# Patient Record
Sex: Male | Born: 1975 | Race: Black or African American | Hispanic: No | Marital: Single | Smoking: Current every day smoker
Health system: Southern US, Community
[De-identification: ages and names within clinical notes are randomized; demographics above are authoritative.]

## PROBLEM LIST (undated history)

## (undated) DIAGNOSIS — I10 Essential (primary) hypertension: Secondary | ICD-10-CM

## (undated) HISTORY — PX: OTHER SURGICAL HISTORY: SHX169

---

## 2000-09-14 ENCOUNTER — Inpatient Hospital Stay (HOSPITAL_COMMUNITY): Admission: EM | Admit: 2000-09-14 | Discharge: 2000-09-15 | Payer: Self-pay

## 2000-09-14 ENCOUNTER — Encounter: Payer: Self-pay | Admitting: Emergency Medicine

## 2000-10-05 ENCOUNTER — Ambulatory Visit (HOSPITAL_COMMUNITY): Admission: RE | Admit: 2000-10-05 | Discharge: 2000-10-05 | Payer: Self-pay | Admitting: Orthopedic Surgery

## 2004-12-10 ENCOUNTER — Emergency Department (HOSPITAL_COMMUNITY): Admission: EM | Admit: 2004-12-10 | Discharge: 2004-12-10 | Payer: Self-pay | Admitting: Emergency Medicine

## 2005-06-04 ENCOUNTER — Emergency Department (HOSPITAL_COMMUNITY): Admission: EM | Admit: 2005-06-04 | Discharge: 2005-06-04 | Payer: Self-pay | Admitting: Emergency Medicine

## 2005-07-07 ENCOUNTER — Emergency Department (HOSPITAL_COMMUNITY): Admission: EM | Admit: 2005-07-07 | Discharge: 2005-07-07 | Payer: Self-pay | Admitting: Emergency Medicine

## 2007-01-23 ENCOUNTER — Emergency Department (HOSPITAL_COMMUNITY): Admission: EM | Admit: 2007-01-23 | Discharge: 2007-01-23 | Payer: Self-pay | Admitting: Emergency Medicine
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
## 2011-05-14 LAB — DIFFERENTIAL
Basophils Absolute: 0
Basophils Relative: 0
Eosinophils Absolute: 0.1
Monocytes Absolute: 0.5
Neutro Abs: 4

## 2011-05-14 LAB — CBC
HCT: 41.5
Hemoglobin: 13.9
Platelets: 207
RDW: 12.9
WBC: 5.7

## 2011-05-14 LAB — COMPREHENSIVE METABOLIC PANEL
ALT: 22
Albumin: 4
Alkaline Phosphatase: 43
BUN: 10
Chloride: 107
Glucose, Bld: 106 — ABNORMAL HIGH
Potassium: 3.4 — ABNORMAL LOW
Sodium: 140
Total Bilirubin: 0.7
Total Protein: 6.5

## 2011-05-14 LAB — URINALYSIS, ROUTINE W REFLEX MICROSCOPIC
Glucose, UA: NEGATIVE
Ketones, ur: NEGATIVE
Nitrite: NEGATIVE
pH: 6

## 2012-11-08 ENCOUNTER — Ambulatory Visit (INDEPENDENT_AMBULATORY_CARE_PROVIDER_SITE_OTHER): Payer: BC Managed Care – PPO | Admitting: General Surgery

## 2012-11-08 ENCOUNTER — Encounter (INDEPENDENT_AMBULATORY_CARE_PROVIDER_SITE_OTHER): Payer: Self-pay | Admitting: General Surgery

## 2012-11-08 VITALS — BP 130/84 | HR 65 | Temp 98.3°F | Resp 16 | Ht 66.0 in | Wt 188.0 lb

## 2012-11-08 DIAGNOSIS — R1032 Left lower quadrant pain: Secondary | ICD-10-CM | POA: Insufficient documentation

## 2012-11-08 NOTE — Patient Instructions (Signed)
Will get ct pelvis to rule out left inguinal hernia

## 2012-11-08 NOTE — Progress Notes (Signed)
Subjective:     Patient ID: Ronald Hardin, male   DOB: Feb 19, 1976, 37 y.o.   MRN: [id number]  HPI We are asked to see the patient in consultation by Dr. Ronaldo Miyamoto to evaluate him for bilateral inguinal hernias. The patient is a 37 year old black male who states that he was told several years ago that he may have an inguinal hernia. 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 Since starting his job he has been experiencing some pulling left lower quadrant pain. He denies any nausea or vomiting. He has not noticed a bulge in either groin.  Review of Systems  Constitutional: Negative.   HENT: Negative.   Eyes: Negative.   Respiratory: Negative.   Cardiovascular: Negative.   Gastrointestinal: Negative.   Endocrine: Negative.   Genitourinary: Negative.   Musculoskeletal: Negative.   Skin: Negative.   Allergic/Immunologic: Negative.   Neurological: Negative.   Hematological: Negative.   Psychiatric/Behavioral: Negative.        Objective:   Physical Exam  Constitutional: He is oriented to person, place, and time. He appears well-developed and well-nourished.  HENT:  Head: Normocephalic and atraumatic.  Eyes: Conjunctivae and EOM are normal. Pupils are equal, round, and reactive to light.  Neck: Normal range of motion. Neck supple.  Cardiovascular: Normal rate, regular rhythm and normal heart sounds.   Pulmonary/Chest: Effort normal and breath sounds normal.  Abdominal: Soft. Bowel sounds are normal.  Genitourinary:  There is no obvious bulge or impulse with straining in either groin.  Musculoskeletal: Normal range of motion.  Neurological: He is alert and oriented to person, place, and time.  Skin: Skin is warm and dry.  Psychiatric: He has a normal mood and affect. His behavior is normal.       Assessment:     The patient has left lower quadrant pain. Clinically I cannot appreciate an inguinal hernia. I will plan to get a CT of his pelvis to look for  radiographic evidence of a hernia     Plan:     Plan for CT of the pelvis and then we will proceed accordingly

## 2012-11-10 ENCOUNTER — Ambulatory Visit
Admission: RE | Admit: 2012-11-10 | Discharge: 2012-11-10 | Disposition: A | Discharge status: Home / Self Care | Payer: BC Managed Care – PPO | Source: Ambulatory Visit | Attending: General Surgery | Admitting: General Surgery

## 2012-11-10 DIAGNOSIS — R1032 Left lower quadrant pain: Secondary | ICD-10-CM

## 2012-11-18 ENCOUNTER — Telehealth (INDEPENDENT_AMBULATORY_CARE_PROVIDER_SITE_OTHER): Payer: Self-pay

## 2012-11-18 NOTE — Telephone Encounter (Signed)
Called patient with negative CT report. Patient will follow up with PCP

## 2013-12-17 ENCOUNTER — Emergency Department (HOSPITAL_COMMUNITY)
Admission: EM | Admit: 2013-12-17 | Discharge: 2013-12-17 | Disposition: A | Discharge status: Home / Self Care | Payer: BC Managed Care – PPO | Attending: Emergency Medicine | Admitting: Emergency Medicine

## 2013-12-17 ENCOUNTER — Encounter (HOSPITAL_COMMUNITY): Payer: Self-pay | Admitting: Emergency Medicine

## 2013-12-17 DIAGNOSIS — F17200 Nicotine dependence, unspecified, uncomplicated: Secondary | ICD-10-CM | POA: Insufficient documentation

## 2013-12-17 DIAGNOSIS — K089 Disorder of teeth and supporting structures, unspecified: Secondary | ICD-10-CM | POA: Insufficient documentation

## 2013-12-17 DIAGNOSIS — K0889 Other specified disorders of teeth and supporting structures: Secondary | ICD-10-CM

## 2013-12-17 DIAGNOSIS — K029 Dental caries, unspecified: Secondary | ICD-10-CM | POA: Insufficient documentation

## 2013-12-17 MED ORDER — CLINDAMYCIN HCL 150 MG PO CAPS
300.0000 mg | ORAL_CAPSULE | Freq: Once | ORAL | Status: AC
  Administered 2013-12-17: 300 mg via ORAL
  Filled 2013-12-17: qty 2

## 2013-12-17 MED ORDER — CLINDAMYCIN HCL 300 MG PO CAPS: 300.0000 mg | ORAL_CAPSULE | Freq: Four times a day (QID) | ORAL | Status: DC

## 2013-12-17 MED ORDER — TRAMADOL HCL 50 MG PO TABS: 50.0000 mg | ORAL_TABLET | Freq: Four times a day (QID) | ORAL | Status: DC | PRN

## 2013-12-17 MED ORDER — OXYCODONE-ACETAMINOPHEN 5-325 MG PO TABS
1.0000 | ORAL_TABLET | Freq: Once | ORAL | Status: AC
Start: 2013-12-17 — End: 2013-12-17
  Administered 2013-12-17: 1 via ORAL
  Filled 2013-12-17: qty 1

## 2013-12-17 NOTE — Discharge Instructions (Signed)
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
## 2013-12-17 NOTE — ED Notes (Signed)
Pt reports dental pain to left upper jaw, states he has known cavities on same side.

## 2013-12-18 NOTE — ED Provider Notes (Signed)
Medical screening examination/treatment/procedure(s) were performed by non-physician practitioner and as supervising physician I was immediately available for consultation/collaboration.     Geoffery Lyons, MD 12/18/13 [phone]

## 2015-02-08 ENCOUNTER — Encounter (HOSPITAL_COMMUNITY): Payer: Self-pay | Admitting: Cardiology

## 2015-02-08 ENCOUNTER — Emergency Department (HOSPITAL_COMMUNITY)
Admission: EM | Admit: 2015-02-08 | Discharge: 2015-02-08 | Disposition: A | Discharge status: Home / Self Care | Payer: Self-pay | Attending: Emergency Medicine | Admitting: Emergency Medicine

## 2015-02-08 DIAGNOSIS — Z720 Tobacco use: Secondary | ICD-10-CM | POA: Insufficient documentation

## 2015-02-08 DIAGNOSIS — Y9289 Other specified places as the place of occurrence of the external cause: Secondary | ICD-10-CM | POA: Insufficient documentation

## 2015-02-08 DIAGNOSIS — S46812A Strain of other muscles, fascia and tendons at shoulder and upper arm level, left arm, initial encounter: Secondary | ICD-10-CM

## 2015-02-08 DIAGNOSIS — Y9389 Activity, other specified: Secondary | ICD-10-CM | POA: Insufficient documentation

## 2015-02-08 DIAGNOSIS — Y998 Other external cause status: Secondary | ICD-10-CM | POA: Insufficient documentation

## 2015-02-08 DIAGNOSIS — S46912A Strain of unspecified muscle, fascia and tendon at shoulder and upper arm level, left arm, initial encounter: Secondary | ICD-10-CM | POA: Insufficient documentation

## 2015-02-08 DIAGNOSIS — X58XXXA Exposure to other specified factors, initial encounter: Secondary | ICD-10-CM | POA: Insufficient documentation

## 2015-02-08 DIAGNOSIS — Z792 Long term (current) use of antibiotics: Secondary | ICD-10-CM | POA: Insufficient documentation

## 2015-02-08 MED ORDER — IBUPROFEN 600 MG PO TABS: 600.0000 mg | ORAL_TABLET | Freq: Three times a day (TID) | ORAL | Status: DC

## 2015-02-08 MED ORDER — METHOCARBAMOL 500 MG PO TABS: ORAL_TABLET | ORAL | Status: DC

## 2015-02-08 NOTE — Discharge Instructions (Signed)
Please soak your neck and shoulder in warm Epsom salt bath daily. Please use ibuprofen 3 times daily with food for the next 5-7 days. Please use Robaxin at bedtime for the next 5-7 days. Please see Dr. Romeo AppleHarrison for orthopedic evaluation if not improving. Muscle Strain A muscle strain (pulled muscle) happens when a muscle is stretched beyond normal length. It happens when a sudden, violent force stretches your muscle too far. Usually, a few of the fibers in your muscle are torn. Muscle strain is common in athletes. Recovery usually takes 1-2 weeks. Complete healing takes 5-6 weeks.  HOME CARE   Follow the PRICE method of treatment to help your injury get better. Do this the first 2-3 days after the injury:  Protect. Protect the muscle to keep it from getting injured again.  Rest. Limit your activity and rest the injured body part.  Ice. Put ice in a plastic bag. Place a towel between your skin and the bag. Then, apply the ice and leave it on from 15-20 minutes each hour. After the third day, switch to moist heat packs.  Compression. Use a splint or elastic bandage on the injured area for comfort. Do not put it on too tightly.  Elevate. Keep the injured body part above the level of your heart.  Only take medicine as told by your doctor.  Warm up before doing exercise to prevent future muscle strains. GET HELP IF:   You have more pain or puffiness (swelling) in the injured area.  You feel numbness, tingling, or notice a loss of strength in the injured area. MAKE SURE YOU:   Understand these instructions.  Will watch your condition.  Will get help right away if you are not doing well or get worse. Document Released: 04/22/2008 Document Revised: 05/04/2013 Document Reviewed: 02/10/2013 [facility] Patient Information 2015 [employer] [place]. This information is not intended to replace advice given to you by your health care provider. Make sure you discuss any questions you have with your health  care provider.
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
## 2015-02-08 NOTE — ED Notes (Signed)
Left sided neck pain times one week.  No injury

## 2016-01-07 ENCOUNTER — Encounter (HOSPITAL_COMMUNITY): Payer: Self-pay | Admitting: Emergency Medicine

## 2016-01-07 ENCOUNTER — Emergency Department (HOSPITAL_COMMUNITY)
Admission: EM | Admit: 2016-01-07 | Discharge: 2016-01-07 | Disposition: A | Discharge status: Home / Self Care | Payer: Self-pay | Attending: Emergency Medicine | Admitting: Emergency Medicine

## 2016-01-07 DIAGNOSIS — G44209 Tension-type headache, unspecified, not intractable: Secondary | ICD-10-CM | POA: Insufficient documentation

## 2016-01-07 DIAGNOSIS — F17210 Nicotine dependence, cigarettes, uncomplicated: Secondary | ICD-10-CM | POA: Insufficient documentation

## 2016-01-07 DIAGNOSIS — Z79899 Other long term (current) drug therapy: Secondary | ICD-10-CM | POA: Insufficient documentation

## 2016-01-07 LAB — I-STAT CHEM 8, ED
BUN: 12 mg/dL (ref 6–20)
CALCIUM ION: 1.18 mmol/L (ref 1.12–1.23)
CREATININE: 0.9 mg/dL (ref 0.61–1.24)
Chloride: 102 mmol/L (ref 101–111)
Glucose, Bld: 89 mg/dL (ref 65–99)
HEMATOCRIT: 44 % (ref 39.0–52.0)
HEMOGLOBIN: 15 g/dL (ref 13.0–17.0)
Potassium: 4 mmol/L (ref 3.5–5.1)
SODIUM: 140 mmol/L (ref 135–145)
TCO2: 26 mmol/L (ref 0–100)

## 2016-01-07 LAB — RAPID STREP SCREEN (MED CTR MEBANE ONLY): Streptococcus, Group A Screen (Direct): NEGATIVE

## 2016-01-07 MED ORDER — IBUPROFEN 400 MG PO TABS
600.0000 mg | ORAL_TABLET | Freq: Once | ORAL | Status: AC
  Administered 2016-01-07: 600 mg via ORAL
  Filled 2016-01-07: qty 2

## 2016-01-07 MED ORDER — ACETAMINOPHEN 500 MG PO TABS
1000.0000 mg | ORAL_TABLET | Freq: Once | ORAL | Status: AC
Start: 2016-01-07 — End: 2016-01-07
  Administered 2016-01-07: 1000 mg via ORAL
  Filled 2016-01-07: qty 2

## 2016-01-07 NOTE — Discharge Instructions (Signed)
Take Tylenol every 4 hours and ibuprofen every 6 hours as needed for pain. Return if he develop any neurologic symptoms such as weakness or numbness on one side of body, headache different than normal, sudden onset headache, confusion or speech changes. Follow-up for blood pressure management with a primary doctor this week.  If you were given medicines take as directed.  If you are on coumadin or contraceptives realize their levels and effectiveness is altered by many different medicines.  If you have any reaction (rash, tongues swelling, other) to the medicines stop taking and see a physician.    If your blood pressure was elevated in the ER make sure you follow up for management with a primary doctor or return for chest pain, shortness of breath or stroke symptoms.  Please follow up as directed and return to the ER or see a physician for new or worsening symptoms.  Thank you. Filed Vitals:   01/07/16 0954 01/07/16 1000 01/07/16 1030  BP: 143/109 130/96 128/102  Pulse: 84 76 82  Temp: 98 F (36.7 C)    TempSrc: Oral    Resp: 18    Height: 5\' 6"  (1.676 m)    Weight: 180 lb (81.647 kg)    SpO2: 100% 96% 98%

## 2016-01-07 NOTE — ED Notes (Signed)
Pt states he has been having pressure in his head with a "full" feeling.  [facility] and [facility].

## 2016-01-07 NOTE — ED Notes (Signed)
Patient given discharge instruction, verbalized understand. Patient ambulatory out of the department.  
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
## 2016-01-10 LAB — CULTURE, GROUP A STREP (THRC)

## 2016-01-21 ENCOUNTER — Emergency Department (HOSPITAL_COMMUNITY): Payer: Self-pay

## 2016-01-21 ENCOUNTER — Encounter (HOSPITAL_COMMUNITY): Payer: Self-pay | Admitting: Emergency Medicine

## 2016-01-21 ENCOUNTER — Emergency Department (HOSPITAL_COMMUNITY)
Admission: EM | Admit: 2016-01-21 | Discharge: 2016-01-21 | Disposition: A | Discharge status: Home / Self Care | Payer: Self-pay | Attending: Emergency Medicine | Admitting: Emergency Medicine

## 2016-01-21 DIAGNOSIS — Z791 Long term (current) use of non-steroidal anti-inflammatories (NSAID): Secondary | ICD-10-CM | POA: Insufficient documentation

## 2016-01-21 DIAGNOSIS — F17210 Nicotine dependence, cigarettes, uncomplicated: Secondary | ICD-10-CM | POA: Insufficient documentation

## 2016-01-21 DIAGNOSIS — R51 Headache: Secondary | ICD-10-CM | POA: Insufficient documentation

## 2016-01-21 DIAGNOSIS — Z7982 Long term (current) use of aspirin: Secondary | ICD-10-CM | POA: Insufficient documentation

## 2016-01-21 DIAGNOSIS — R519 Headache, unspecified: Secondary | ICD-10-CM

## 2016-01-21 MED ORDER — DIPHENHYDRAMINE HCL 50 MG/ML IJ SOLN
25.0000 mg | Freq: Once | INTRAMUSCULAR | Status: AC
  Administered 2016-01-21: 25 mg via INTRAVENOUS
  Filled 2016-01-21: qty 1

## 2016-01-21 MED ORDER — KETOROLAC TROMETHAMINE 30 MG/ML IJ SOLN
30.0000 mg | Freq: Once | INTRAMUSCULAR | Status: AC
  Administered 2016-01-21: 30 mg via INTRAVENOUS
  Filled 2016-01-21: qty 1

## 2016-01-21 MED ORDER — METOCLOPRAMIDE HCL 5 MG/ML IJ SOLN
10.0000 mg | Freq: Once | INTRAMUSCULAR | Status: AC
  Administered 2016-01-21: 10 mg via INTRAVENOUS
  Filled 2016-01-21: qty 2

## 2016-01-21 NOTE — ED Notes (Signed)
Patient given discharge instruction, verbalized understand. IV removed, band aid applied. Patient ambulatory out of the department.  
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
## 2016-01-21 NOTE — Discharge Instructions (Signed)

## 2016-01-21 NOTE — ED Notes (Signed)
Patient complaining of headache x 2 weeks. States he was treated here recently and told he had high blood pressure but was unable to follow up with clinic.

## 2016-11-19 IMAGING — CT CT HEAD W/O CM
3 of 4 series · 15 of 47 positions shown, 18 images · non-contrast
Comparison: None.

CLINICAL DATA: Persistent headaches for 2 weeks. Pain with eye
movement.

EXAM:
CT HEAD WITHOUT CONTRAST
TECHNIQUE: Contiguous axial images were obtained from the base of the skull
through the vertex without intravenous contrast.

[Series 2: head w/o · axial · non-contrast · 0.44mm/px · z∈[+59,+195]mm · 9 of 40 slices shown, 12 images]
[im 3/40  brain]
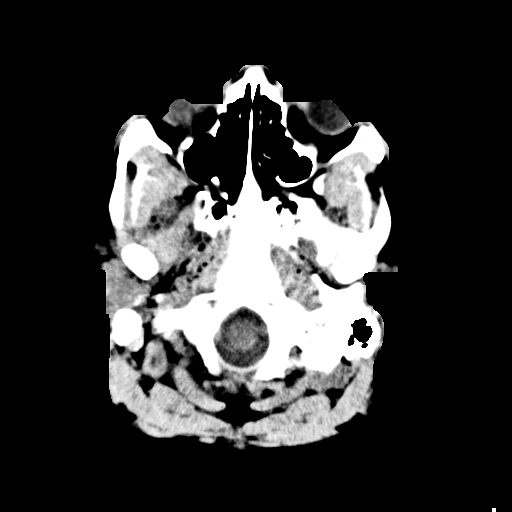
[im 3/40  bone]
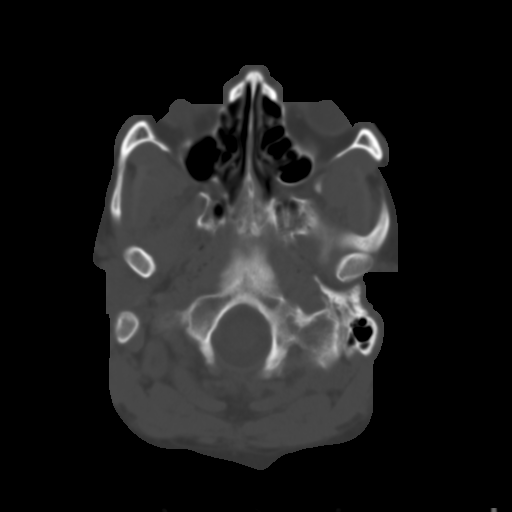
[im 9/40  brain]
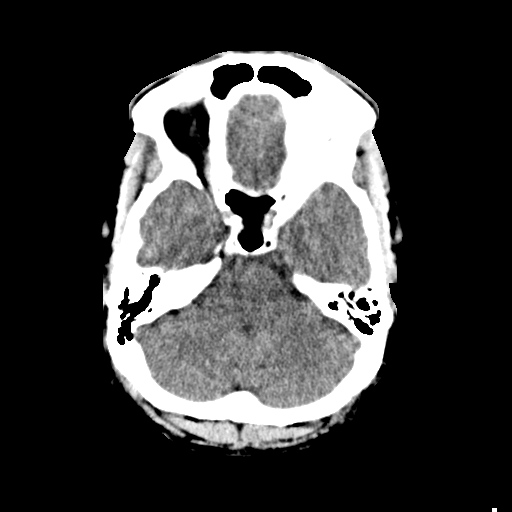
[im 12/40  brain]
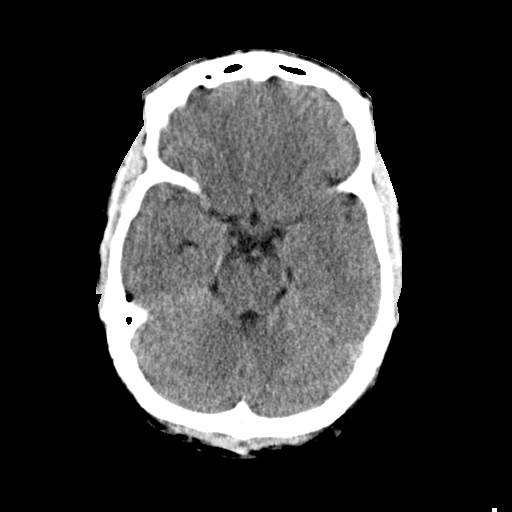
[im 17/40  brain]
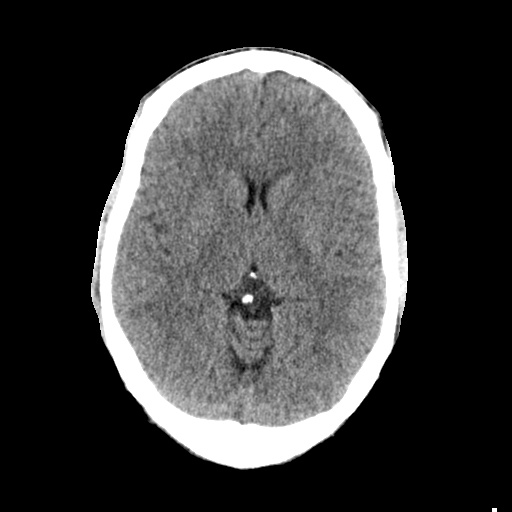
[im 20/40  brain]
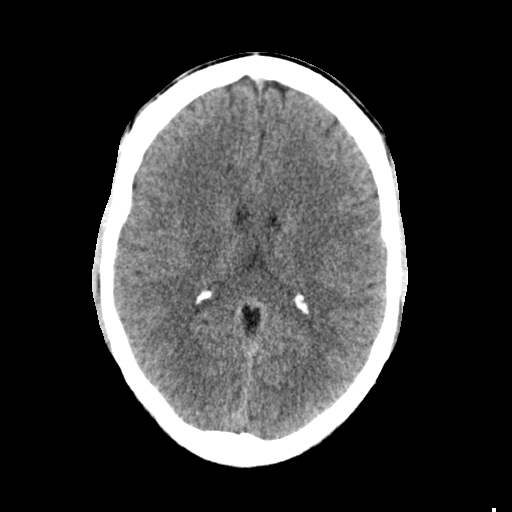
[im 20/40  bone]
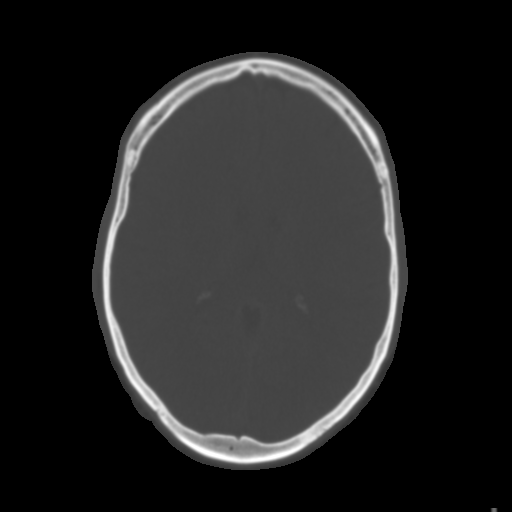
[im 23/40  brain]
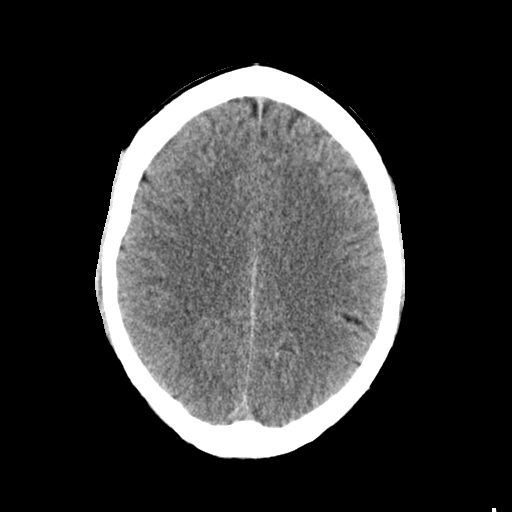
[im 28/40  brain]
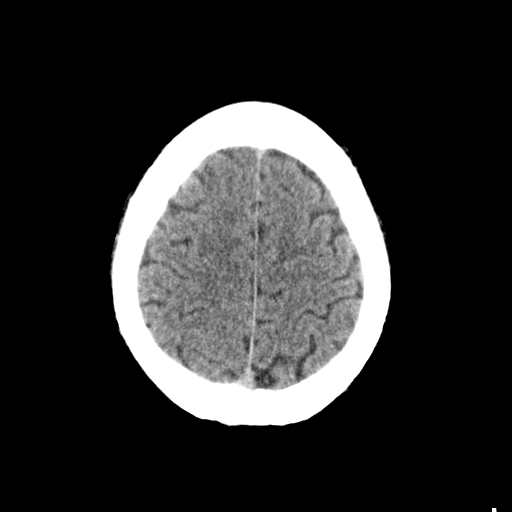
[im 31/40  brain]
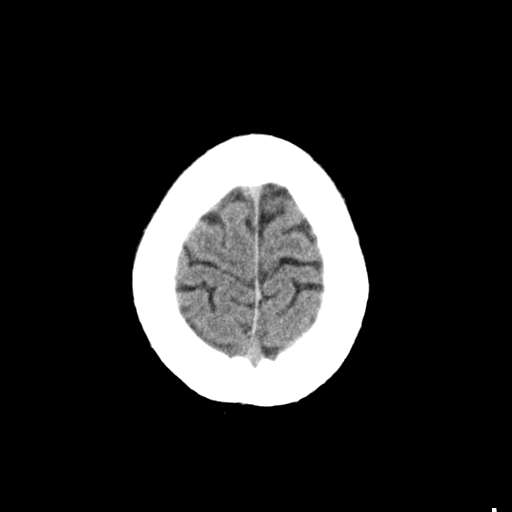
[im 37/40  brain]
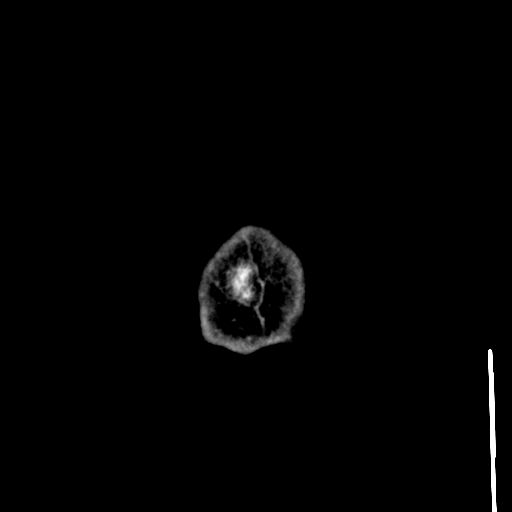
[im 37/40  bone]
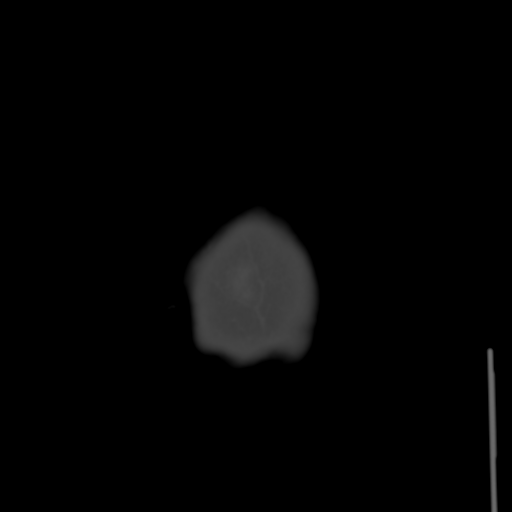

[Series 4: coronal · coronal · 0.32mm/px · 3 of 70 slices shown]
[im 24/70  brain]
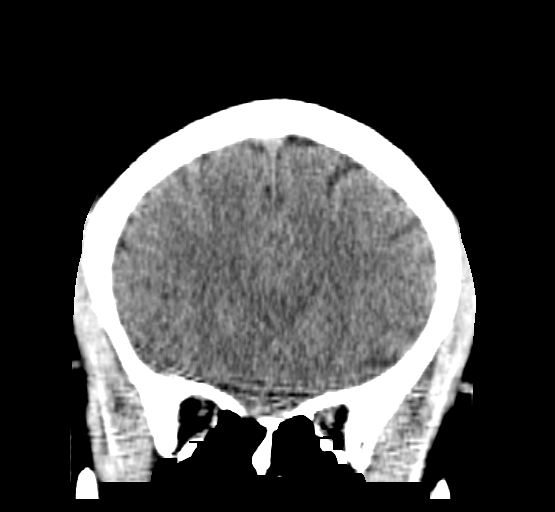
[im 31/70  brain]
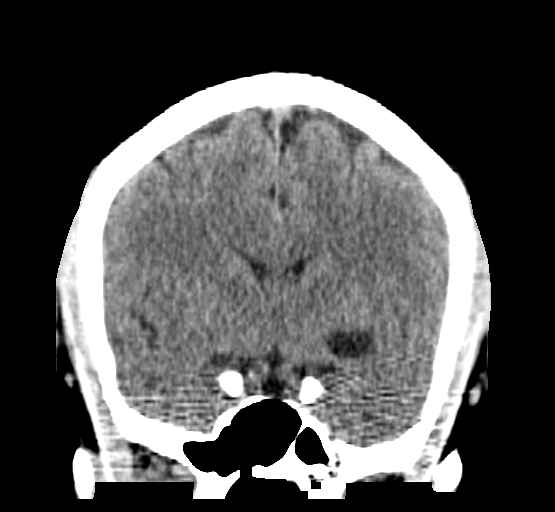
[im 39/70  brain]
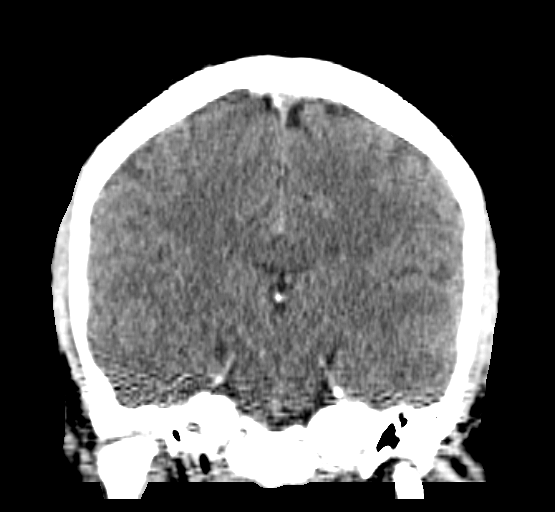

[Series 5: sagittal · sagittal · 0.32mm/px · 3 of 55 slices shown]
[im 19/55  brain]
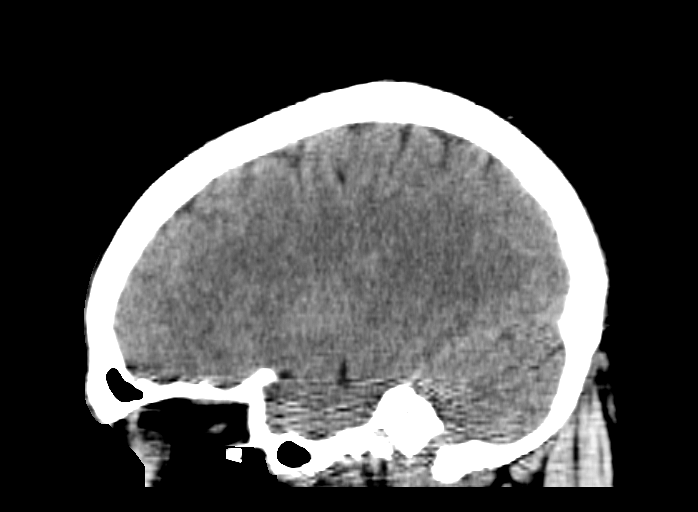
[im 28/55  brain]
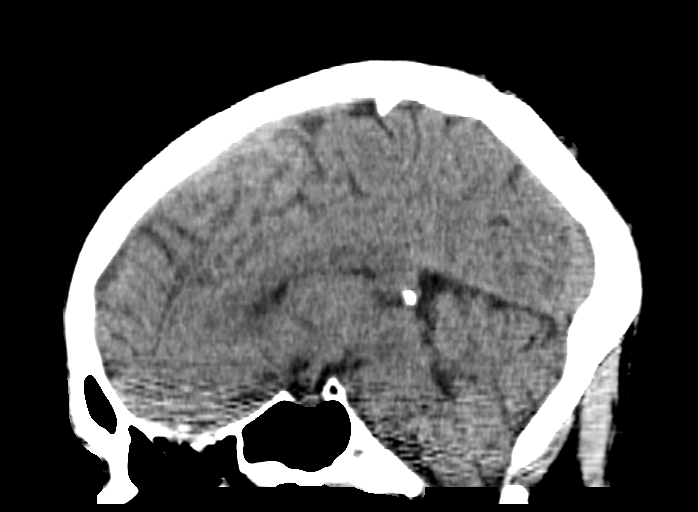
[im 37/55  brain]
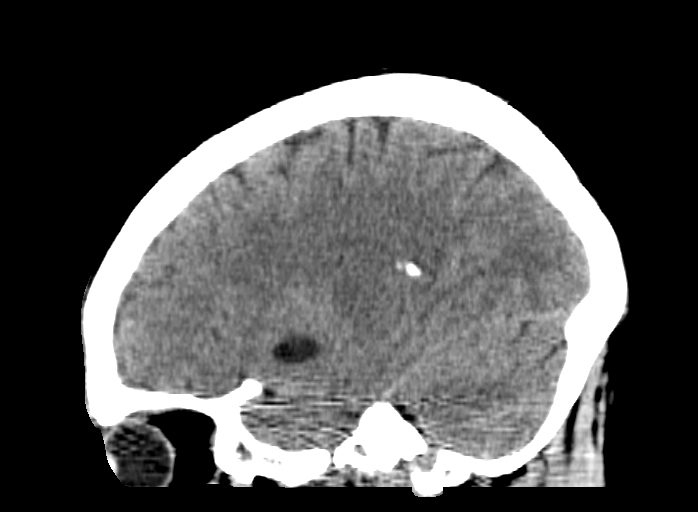

[15 of 47 positions shown; findings below may reference images not displayed]

FINDINGS: Brain: No evidence of acute infarction, hemorrhage, hydrocephalus,
or mass-effect. 15 millimeter CSF density ovoid structure along the
lower left sylvian fissure is likely a dilated perivascular space.

Vascular: No hyperdense vessel or unexpected calcification.

Skull: Negative for fracture or focal lesion.

Sinuses/Orbits: No acute finding. No inflammatory findings to
explain painful eye movement.

Other: None.
IMPRESSION: Negative exam.

## 2017-12-13 DIAGNOSIS — F17290 Nicotine dependence, other tobacco product, uncomplicated: Secondary | ICD-10-CM | POA: Insufficient documentation

## 2017-12-13 DIAGNOSIS — K0889 Other specified disorders of teeth and supporting structures: Secondary | ICD-10-CM | POA: Insufficient documentation

## 2017-12-14 ENCOUNTER — Encounter (HOSPITAL_COMMUNITY): Payer: Self-pay | Admitting: *Deleted

## 2017-12-14 ENCOUNTER — Other Ambulatory Visit: Payer: Self-pay

## 2017-12-14 ENCOUNTER — Emergency Department (HOSPITAL_COMMUNITY)
Admission: EM | Admit: 2017-12-14 | Discharge: 2017-12-14 | Disposition: A | Discharge status: Home / Self Care | Payer: Self-pay | Attending: Emergency Medicine | Admitting: Emergency Medicine

## 2017-12-14 DIAGNOSIS — K0889 Other specified disorders of teeth and supporting structures: Secondary | ICD-10-CM

## 2017-12-14 MED ORDER — NAPROXEN 500 MG PO TABS: 500.0000 mg | ORAL_TABLET | Freq: Two times a day (BID) | ORAL | 0 refills | Status: DC

## 2017-12-14 MED ORDER — NAPROXEN 250 MG PO TABS
500.0000 mg | ORAL_TABLET | Freq: Once | ORAL | Status: AC
  Administered 2017-12-14: 500 mg via ORAL
  Filled 2017-12-14: qty 2

## 2017-12-14 MED ORDER — PENICILLIN V POTASSIUM 500 MG PO TABS: 500.0000 mg | ORAL_TABLET | Freq: Four times a day (QID) | ORAL | 0 refills | Status: AC

## 2017-12-14 MED ORDER — PENICILLIN V POTASSIUM 250 MG PO TABS
500.0000 mg | ORAL_TABLET | Freq: Once | ORAL | Status: AC
  Administered 2017-12-14: 500 mg via ORAL
  Filled 2017-12-14: qty 2
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
## 2017-12-14 NOTE — ED Triage Notes (Signed)
Pt c/o left upper dental pain that started a week ago, pain became worse today,

## 2017-12-14 NOTE — Discharge Instructions (Addendum)
Follow-up with your dentist for extraction tomorrow as scheduled.  Your blood pressure is elevated which is likely due to pain.  You should follow-up with your primary doctor regarding this.  Return to the ED if your have difficulty breathing, difficulty swallowing or other concerns.

## 2019-02-18 ENCOUNTER — Other Ambulatory Visit: Payer: Self-pay

## 2019-02-18 DIAGNOSIS — Z20822 Contact with and (suspected) exposure to covid-19: Secondary | ICD-10-CM

## 2019-02-21 LAB — NOVEL CORONAVIRUS, NAA: SARS-CoV-2, NAA: DETECTED — AB

## 2019-09-01 ENCOUNTER — Telehealth: Payer: Self-pay

## 2019-12-30 ENCOUNTER — Telehealth: Payer: Self-pay

## 2020-06-27 ENCOUNTER — Encounter (HOSPITAL_COMMUNITY): Payer: Self-pay

## 2020-06-27 ENCOUNTER — Emergency Department (HOSPITAL_COMMUNITY)
Admission: EM | Admit: 2020-06-27 | Discharge: 2020-06-27 | Disposition: A | Discharge status: Home / Self Care | Payer: Self-pay | Attending: Emergency Medicine | Admitting: Emergency Medicine

## 2020-06-27 ENCOUNTER — Other Ambulatory Visit: Payer: Self-pay

## 2020-06-27 DIAGNOSIS — F17290 Nicotine dependence, other tobacco product, uncomplicated: Secondary | ICD-10-CM | POA: Insufficient documentation

## 2020-06-27 DIAGNOSIS — M545 Low back pain, unspecified: Secondary | ICD-10-CM | POA: Insufficient documentation

## 2020-06-27 DIAGNOSIS — X500XXA Overexertion from strenuous movement or load, initial encounter: Secondary | ICD-10-CM | POA: Insufficient documentation

## 2020-06-27 DIAGNOSIS — S39012A Strain of muscle, fascia and tendon of lower back, initial encounter: Secondary | ICD-10-CM

## 2020-06-27 MED ORDER — MELOXICAM 15 MG PO TABS: 15.0000 mg | ORAL_TABLET | Freq: Every day | ORAL | 0 refills | Status: DC

## 2020-06-27 MED ORDER — CYCLOBENZAPRINE HCL 10 MG PO TABS: 5.0000 mg | ORAL_TABLET | Freq: Two times a day (BID) | ORAL | 0 refills | Status: DC | PRN

## 2020-06-27 MED ORDER — HYDROCODONE-ACETAMINOPHEN 5-325 MG PO TABS: 1.0000 | ORAL_TABLET | Freq: Four times a day (QID) | ORAL | 0 refills | Status: DC | PRN

## 2020-06-27 NOTE — ED Notes (Signed)
Pt provided with printed discharge instructions. Educated on ordered medications and pt verbalized understanding with teach back. All questions and concerns voiced addressed at this time.

## 2020-06-27 NOTE — ED Notes (Signed)
Entered room and introduced self to patient at this time. Pt appears uncomfortable in bed, respirations are even and unlabored with equal chest rise and fall. Bed is locked in the lowest position, side rails x2, call bell within reach. All questions and concerns voiced addressed at this time.

## 2020-06-27 NOTE — ED Triage Notes (Signed)
Pt reports back spasms for the past 1-2 weeks, worse today.  Reports pain is in lower back.  Denies any bowel or bladder symptoms.  Denies any numbness, tingling, or weakness in extremities.  Denies injury.  Reports does a lot of heavy lifting at work.

## 2020-06-27 NOTE — Discharge Instructions (Signed)
SEEK IMMEDIATE MEDICAL ATTENTION IF: New numbness, tingling, weakness, or problem with the use of your arms or legs.  Severe back pain not relieved with medications.  Change in bowel or bladder control.  Increasing pain in any areas of the body (such as chest or abdominal pain).  Shortness of breath, dizziness or fainting.  Nausea (feeling sick to your stomach), vomiting, fever, or sweats.  

## 2020-06-27 NOTE — Clinical Social Work Note (Signed)
Transition of Care [facility]) - Emergency Department Mini Assessment  Patient Details  Name: Ronald Hardin MRN: [id number] Date of Birth: 1976/05/27  [facility] of [facility]) CM/SW Contact:    Ewing Schlein, LCSW Phone Number: 06/27/2020, 11:29 AM  Clinical Narrative: Patient is a 44 year old male who presented to the ED for back pain. Per chart review, patient does not have a PCP or insurance. CSW spoke with patient regarding making referral to [facility] for PCP assistance. Patient agreeable to referral. CSW made referral to [facility]. TOC signing off.  ED Mini Assessment: What brought you to the Emergency Department? : Back pain Barriers to Discharge: Inadequate or no insurance, ED PCP establishment Barrier interventions: Referral to [facility] Means of departure: Car Interventions which prevented an admission or readmission: Other (must enter comment) (Referral to [facility])  Patient Contact and Communications Key Contact 1: [facility] Contact Date: 06/27/20 Contact time: 1125 Contact Phone Number: [phone] Call outcome: Referral made Choice offered to / list presented to : Patient  Admission diagnosis:  back pain Patient Active Problem List   Diagnosis Date Noted  . Left groin pain 11/08/2012   PCP:  Patient, No Pcp Per Pharmacy:   [facility] - [place], [place] - [address] [place] [address] Phone: [phone] Fax: [phone]  [facility] [address], [place] - [address] [place] [address] [place] [address] Phone: [phone] Fax: [phone]
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
## 2020-10-19 ENCOUNTER — Encounter: Payer: Self-pay | Admitting: Emergency Medicine

## 2020-10-19 ENCOUNTER — Ambulatory Visit
Admission: EM | Admit: 2020-10-19 | Discharge: 2020-10-19 | Disposition: A | Discharge status: Home / Self Care | Payer: Self-pay | Attending: Internal Medicine | Admitting: Internal Medicine

## 2020-10-19 ENCOUNTER — Other Ambulatory Visit: Payer: Self-pay

## 2020-10-19 DIAGNOSIS — I10 Essential (primary) hypertension: Secondary | ICD-10-CM

## 2020-10-19 MED ORDER — AMLODIPINE BESYLATE 10 MG PO TABS: 10.0000 mg | ORAL_TABLET | Freq: Every day | ORAL | 1 refills | Status: DC

## 2020-10-19 NOTE — ED Triage Notes (Signed)
States he has been having a headache for a while now.  Went to the dentist to get a tooth pulled and they told him his blood pressure was to high for them to do the procedure.  Has been having episodes of feeling like he is going to pass out.
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
## 2020-10-19 NOTE — Discharge Instructions (Addendum)
Please take your blood pressure medication at bedtime We will call you with results of your labs if abnormal Decrease salt intake to less than 2 g a day Exercise at least 150 minutes a week Weight loss is beneficial for blood pressure control.

## 2020-10-20 LAB — BASIC METABOLIC PANEL
BUN/Creatinine Ratio: 15 (ref 9–20)
BUN: 17 mg/dL (ref 6–24)
CO2: 21 mmol/L (ref 20–29)
Calcium: 10 mg/dL (ref 8.7–10.2)
Chloride: 102 mmol/L (ref 96–106)
Creatinine, Ser: 1.11 mg/dL (ref 0.76–1.27)
Glucose: 95 mg/dL (ref 65–99)
Potassium: 4.4 mmol/L (ref 3.5–5.2)
Sodium: 144 mmol/L (ref 134–144)
eGFR: 85 mL/min/{1.73_m2} (ref >59)

## 2021-01-30 ENCOUNTER — Other Ambulatory Visit: Payer: Self-pay

## 2021-01-30 ENCOUNTER — Encounter (HOSPITAL_COMMUNITY): Payer: Self-pay

## 2021-01-30 ENCOUNTER — Emergency Department (HOSPITAL_COMMUNITY)
Admission: EM | Admit: 2021-01-30 | Discharge: 2021-01-30 | Disposition: A | Discharge status: Home / Self Care | Payer: Self-pay | Attending: Emergency Medicine | Admitting: Emergency Medicine

## 2021-01-30 DIAGNOSIS — Z79899 Other long term (current) drug therapy: Secondary | ICD-10-CM | POA: Insufficient documentation

## 2021-01-30 DIAGNOSIS — S39012A Strain of muscle, fascia and tendon of lower back, initial encounter: Secondary | ICD-10-CM | POA: Insufficient documentation

## 2021-01-30 DIAGNOSIS — R030 Elevated blood-pressure reading, without diagnosis of hypertension: Secondary | ICD-10-CM

## 2021-01-30 DIAGNOSIS — F17290 Nicotine dependence, other tobacco product, uncomplicated: Secondary | ICD-10-CM | POA: Insufficient documentation

## 2021-01-30 DIAGNOSIS — I10 Essential (primary) hypertension: Secondary | ICD-10-CM | POA: Insufficient documentation

## 2021-01-30 DIAGNOSIS — X500XXA Overexertion from strenuous movement or load, initial encounter: Secondary | ICD-10-CM | POA: Insufficient documentation

## 2021-01-30 DIAGNOSIS — Y990 Civilian activity done for income or pay: Secondary | ICD-10-CM | POA: Insufficient documentation

## 2021-01-30 HISTORY — DX: Essential (primary) hypertension: I10

## 2021-01-30 MED ORDER — HYDROCHLOROTHIAZIDE 25 MG PO TABS: 25.0000 mg | ORAL_TABLET | Freq: Every day | ORAL | 1 refills | Status: DC

## 2021-01-30 MED ORDER — IBUPROFEN 600 MG PO TABS: 600.0000 mg | ORAL_TABLET | Freq: Four times a day (QID) | ORAL | 0 refills | Status: DC | PRN

## 2021-01-30 MED ORDER — NAPROXEN 250 MG PO TABS
500.0000 mg | ORAL_TABLET | Freq: Once | ORAL | Status: AC
  Administered 2021-01-30: 500 mg via ORAL
  Filled 2021-01-30: qty 2

## 2021-01-30 MED ORDER — TIZANIDINE HCL 2 MG PO CAPS: 2.0000 mg | ORAL_CAPSULE | Freq: Three times a day (TID) | ORAL | 0 refills | Status: DC
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
## 2021-01-30 NOTE — ED Triage Notes (Signed)
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

## 2021-01-30 NOTE — Discharge Instructions (Addendum)
Please get a primary care doctor as soon as you are able.  Read instructions provided on blood pressure.

## 2021-08-13 ENCOUNTER — Emergency Department (HOSPITAL_COMMUNITY): Payer: PRIVATE HEALTH INSURANCE

## 2021-08-13 ENCOUNTER — Encounter (HOSPITAL_COMMUNITY): Payer: Self-pay

## 2021-08-13 ENCOUNTER — Emergency Department (HOSPITAL_COMMUNITY)
Admission: EM | Admit: 2021-08-13 | Discharge: 2021-08-13 | Disposition: A | Discharge status: Home / Self Care | Payer: PRIVATE HEALTH INSURANCE | Attending: Emergency Medicine | Admitting: Emergency Medicine

## 2021-08-13 DIAGNOSIS — Y990 Civilian activity done for income or pay: Secondary | ICD-10-CM | POA: Diagnosis not present

## 2021-08-13 DIAGNOSIS — M25511 Pain in right shoulder: Secondary | ICD-10-CM | POA: Insufficient documentation

## 2021-08-13 DIAGNOSIS — X500XXA Overexertion from strenuous movement or load, initial encounter: Secondary | ICD-10-CM | POA: Insufficient documentation

## 2021-08-13 MED ORDER — DICLOFENAC SODIUM 75 MG PO TBEC: 75.0000 mg | DELAYED_RELEASE_TABLET | Freq: Two times a day (BID) | ORAL | 0 refills | Status: DC

## 2021-08-13 MED ORDER — AMLODIPINE BESYLATE 10 MG PO TABS: 10.0000 mg | ORAL_TABLET | Freq: Every day | ORAL | 1 refills | Status: DC

## 2021-08-13 NOTE — ED Provider Notes (Signed)
[facility] EMERGENCY DEPARTMENT Provider Note   CSN: [id number] Arrival date & time: 08/13/21  1906     History  Chief Complaint  Patient presents with   Shoulder Pain    SHONTE SODERLUND is a 46 y.o. male.  Pt reports he was trying to lift a heavy package at work a week ago and shoulder popped,  Pt reports shoulder popped again today and now he has trouble moving.  Pt has a history of high blood pressure.  He is out of his medications   The history is provided by the patient. No language interpreter was used.  Shoulder Pain Location:  Shoulder Shoulder location:  R shoulder Injury: no   Pain details:    Quality:  Aching     Home Medications Prior to Admission medications   Medication Sig Start Date End Date Taking? Authorizing Provider  diclofenac (VOLTAREN) 75 MG EC tablet Take 1 tablet (75 mg total) by mouth 2 (two) times daily. 08/13/21  Yes Elson Areas, PA-C  acetaminophen (TYLENOL) 500 MG tablet Take 500 mg by mouth every 6 (six) hours as needed.    [provider]  amLODipine (NORVASC) 10 MG tablet Take 1 tablet (10 mg total) by mouth daily. 10/19/20   Merrilee Jansky, MD  hydrochlorothiazide (HYDRODIURIL) 25 MG tablet Take 1 tablet (25 mg total) by mouth daily. 01/30/21   Derwood Kaplan, MD  ibuprofen (ADVIL) 600 MG tablet Take 1 tablet (600 mg total) by mouth every 6 (six) hours as needed. 01/30/21   Derwood Kaplan, MD  tizanidine (ZANAFLEX) 2 MG capsule Take 1 capsule (2 mg total) by mouth 3 (three) times daily. 01/30/21   Derwood Kaplan, MD      Allergies    Patient has no known allergies.    Review of Systems   Review of Systems  All other systems reviewed and are negative.  Physical Exam Updated Vital Signs BP (!) 159/103 (BP Location: Left Arm)    Pulse 72    Temp 98.1 F (36.7 C) (Oral)    Resp 14    Ht 5\' 6"  (1.676 m)    Wt 77.5 kg    SpO2 99%    BMI 27.58 kg/m  Physical Exam Vitals and nursing note reviewed.  Constitutional:       Appearance: He is well-developed.  HENT:     Head: Normocephalic.     Mouth/Throat:     Mouth: Mucous membranes are moist.  Pulmonary:     Effort: Pulmonary effort is normal.  Abdominal:     General: Abdomen is flat. There is no distension.  Musculoskeletal:        General: Tenderness and signs of injury present. No swelling. Normal range of motion.     Cervical back: Normal range of motion.  Skin:    General: Skin is warm.  Neurological:     General: No focal deficit present.     Mental Status: He is alert and oriented to person, place, and time.  Psychiatric:        Mood and Affect: Mood normal.    ED Results / Procedures / Treatments   Labs (all labs ordered are listed, but only abnormal results are displayed) Labs Reviewed - No data to display  EKG None  Radiology DG Shoulder Right  Result Date: 08/13/2021 CLINICAL DATA:  Provided history: Limited range of motion, heard a pop. Additional history provided: Patient reports pop and severe pain in right shoulder after lifting boxes  at work. EXAM: RIGHT SHOULDER - 2+ VIEW COMPARISON:  No pertinent prior exams available for comparison. FINDINGS: There is normal bony alignment. No evidence of acute osseous or articular abnormality. The joint spaces are maintained. IMPRESSION: No evidence of acute osseous or articular abnormality. Electronically Signed   By: Jackey Loge D.O.   On: 08/13/2021 19:43    Procedures Procedures    Medications Ordered in ED Medications - No data to display  ED Course/ Medical Decision Making/ A&P                           Medical Decision Making Amount and/or Complexity of Data Reviewed Radiology: ordered and independent interpretation performed. Decision-making details documented in ED Course.    Details: xxray  of shoulder,  no acute findings  Risk Prescription drug management.          Final Clinical Impression(s) / ED Diagnoses Final diagnoses:  Acute pain of right shoulder     Rx / DC Orders ED Discharge Orders          Ordered    diclofenac (VOLTAREN) 75 MG EC tablet  2 times daily        08/13/21 2031           An After Visit Summary was printed and given to the patient.    Osie Cheeks 08/13/21 2034    Gloris Manchester, MD 08/15/21 1949

## 2021-08-13 NOTE — Discharge Instructions (Addendum)
Schedule to see the Orthopaedist for evaltuion  

## 2021-08-13 NOTE — ED Triage Notes (Signed)
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

## 2021-08-15 ENCOUNTER — Telehealth: Payer: Self-pay | Admitting: Orthopedic Surgery

## 2021-08-15 NOTE — Telephone Encounter (Signed)
Patient called following visit at [facility] emergency room visit 08/13/21 for his right shoulder injury, which he reays occurred at work. 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 Discussed worker's comp protocol and the requirement for approval by worker's comp. Awaiting authorization; encouraged patient to speak with supervisior to try to facilitate process.

## 2021-08-16 NOTE — Telephone Encounter (Signed)
Patient called back - states he is to be receiving a case number, and asked again to schedule; relayed as noted, to have employer or insured party/worker's comp, contact us at this time to schedule, as authorization is needed.

## 2021-08-16 NOTE — Telephone Encounter (Signed)
Call received back from patient's employer, "[employer]" per Wynelle Bourgeois, [phone], with authorization, CLAIM# [id number], under [employer].. I called patient to offer appointment; no voice mail, unable to leave message. I called back to employer; scheduled.

## 2021-08-19 ENCOUNTER — Ambulatory Visit (INDEPENDENT_AMBULATORY_CARE_PROVIDER_SITE_OTHER): Payer: PRIVATE HEALTH INSURANCE | Admitting: Orthopedic Surgery

## 2021-08-19 ENCOUNTER — Encounter: Payer: Self-pay | Admitting: Orthopedic Surgery

## 2021-08-19 ENCOUNTER — Other Ambulatory Visit: Payer: Self-pay

## 2021-08-19 VITALS — BP 142/102 | HR 104 | Ht 66.0 in | Wt 167.0 lb

## 2021-08-19 DIAGNOSIS — S43431A Superior glenoid labrum lesion of right shoulder, initial encounter: Secondary | ICD-10-CM

## 2021-08-19 DIAGNOSIS — M25511 Pain in right shoulder: Secondary | ICD-10-CM | POA: Diagnosis not present

## 2021-08-19 MED ORDER — DICLOFENAC SODIUM 75 MG PO TBEC: 75.0000 mg | DELAYED_RELEASE_TABLET | Freq: Two times a day (BID) | ORAL | 2 refills | Status: DC

## 2021-08-19 NOTE — Telephone Encounter (Signed)
Done
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
## 2021-08-19 NOTE — Patient Instructions (Signed)
OOW X 4 WEEKS FROM TODAY   MRI W JOINT CONTRAST   DICLOFENAC 75 MG BID

## 2021-08-26 ENCOUNTER — Encounter: Payer: Self-pay | Admitting: Orthopedic Surgery

## 2021-08-26 ENCOUNTER — Ambulatory Visit (INDEPENDENT_AMBULATORY_CARE_PROVIDER_SITE_OTHER): Payer: PRIVATE HEALTH INSURANCE | Admitting: Orthopedic Surgery

## 2021-08-26 ENCOUNTER — Telehealth: Payer: Self-pay | Admitting: Radiology

## 2021-08-26 ENCOUNTER — Telehealth: Payer: Self-pay | Admitting: Orthopedic Surgery

## 2021-08-26 ENCOUNTER — Other Ambulatory Visit: Payer: Self-pay

## 2021-08-26 DIAGNOSIS — M25511 Pain in right shoulder: Secondary | ICD-10-CM | POA: Diagnosis not present

## 2021-08-26 NOTE — Patient Instructions (Signed)
We will get approval from your workers comp for the physical therapy, you will get a call from them to schedule, they will let you know when and where

## 2021-08-26 NOTE — Telephone Encounter (Signed)
I need to get approval for his physical therapy I have pulled up the [facility] information I do not see a phone and fax number only see a claim number Can one of you look behind me and see if you can view this information and let me know how to as well?   Thanks.

## 2021-08-26 NOTE — Progress Notes (Signed)
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

## 2021-08-26 NOTE — Telephone Encounter (Signed)
Patient called to relay he has had MRI done and will bring CD to scheduled appointment Wednesday, 08/28/21(also on wait list). Patient asked about the medication prescribed by Dr Romeo Apple; states that it is causing dizziness; said not sure it is this medicine or his other medications. Please advise.

## 2021-08-26 NOTE — Telephone Encounter (Signed)
75 mg twice daily diclofenac  States causing dizziness can you change?

## 2021-08-27 ENCOUNTER — Emergency Department (HOSPITAL_COMMUNITY)
Admission: EM | Admit: 2021-08-27 | Discharge: 2021-08-27 | Disposition: A | Discharge status: Home / Self Care | Payer: PRIVATE HEALTH INSURANCE | Attending: Emergency Medicine | Admitting: Emergency Medicine

## 2021-08-27 ENCOUNTER — Encounter (HOSPITAL_COMMUNITY): Payer: Self-pay | Admitting: Emergency Medicine

## 2021-08-27 ENCOUNTER — Telehealth: Payer: Self-pay | Admitting: Orthopedic Surgery

## 2021-08-27 ENCOUNTER — Emergency Department (HOSPITAL_COMMUNITY): Payer: PRIVATE HEALTH INSURANCE

## 2021-08-27 ENCOUNTER — Other Ambulatory Visit: Payer: Self-pay | Admitting: Orthopedic Surgery

## 2021-08-27 ENCOUNTER — Other Ambulatory Visit: Payer: Self-pay

## 2021-08-27 DIAGNOSIS — M25511 Pain in right shoulder: Secondary | ICD-10-CM

## 2021-08-27 DIAGNOSIS — R42 Dizziness and giddiness: Secondary | ICD-10-CM | POA: Insufficient documentation

## 2021-08-27 DIAGNOSIS — D72819 Decreased white blood cell count, unspecified: Secondary | ICD-10-CM | POA: Insufficient documentation

## 2021-08-27 DIAGNOSIS — Z79899 Other long term (current) drug therapy: Secondary | ICD-10-CM | POA: Insufficient documentation

## 2021-08-27 DIAGNOSIS — R110 Nausea: Secondary | ICD-10-CM | POA: Insufficient documentation

## 2021-08-27 DIAGNOSIS — I10 Essential (primary) hypertension: Secondary | ICD-10-CM | POA: Diagnosis not present

## 2021-08-27 LAB — COMPREHENSIVE METABOLIC PANEL
ALT: 32 U/L (ref 0–44)
AST: 28 U/L (ref 15–41)
Albumin: 4.8 g/dL (ref 3.5–5.0)
Alkaline Phosphatase: 54 U/L (ref 38–126)
Anion gap: 9 (ref 5–15)
BUN: 18 mg/dL (ref 6–20)
CO2: 26 mmol/L (ref 22–32)
Calcium: 9.7 mg/dL (ref 8.9–10.3)
Chloride: 103 mmol/L (ref 98–111)
Creatinine, Ser: 1.04 mg/dL (ref 0.61–1.24)
GFR, Estimated: >60 mL/min (ref >60)
Glucose, Bld: 113 mg/dL — ABNORMAL HIGH (ref 70–99)
Potassium: 3.4 mmol/L — ABNORMAL LOW (ref 3.5–5.1)
Sodium: 138 mmol/L (ref 135–145)
Total Bilirubin: 1.4 mg/dL — ABNORMAL HIGH (ref 0.3–1.2)
Total Protein: 7.9 g/dL (ref 6.5–8.1)

## 2021-08-27 LAB — CBC WITH DIFFERENTIAL/PLATELET
Abs Immature Granulocytes: 0.01 10*3/uL (ref 0.00–0.07)
Basophils Absolute: 0 10*3/uL (ref 0.0–0.1)
Basophils Relative: 1 %
Eosinophils Absolute: 0.1 10*3/uL (ref 0.0–0.5)
Eosinophils Relative: 2 %
HCT: 48.9 % (ref 39.0–52.0)
Hemoglobin: 16.7 g/dL (ref 13.0–17.0)
Immature Granulocytes: 0 %
Lymphocytes Relative: 46 %
Lymphs Abs: 1.5 10*3/uL (ref 0.7–4.0)
MCH: 30.2 pg (ref 26.0–34.0)
MCHC: 34.2 g/dL (ref 30.0–36.0)
MCV: 88.4 fL (ref 80.0–100.0)
Monocytes Absolute: 0.3 10*3/uL (ref 0.1–1.0)
Monocytes Relative: 8 %
Neutro Abs: 1.4 10*3/uL — ABNORMAL LOW (ref 1.7–7.7)
Neutrophils Relative %: 43 %
Platelets: 199 10*3/uL (ref 150–400)
RBC: 5.53 MIL/uL (ref 4.22–5.81)
RDW: 12.4 % (ref 11.5–15.5)
WBC: 3.2 10*3/uL — ABNORMAL LOW (ref 4.0–10.5)
nRBC: 0 % (ref 0.0–0.2)

## 2021-08-27 LAB — MAGNESIUM: Magnesium: 2 mg/dL (ref 1.7–2.4)

## 2021-08-27 MED ORDER — MECLIZINE HCL 12.5 MG PO TABS
25.0000 mg | ORAL_TABLET | Freq: Once | ORAL | Status: AC
  Administered 2021-08-27: 25 mg via ORAL
  Filled 2021-08-27: qty 2

## 2021-08-27 MED ORDER — LACTATED RINGERS IV BOLUS
1000.0000 mL | Freq: Once | INTRAVENOUS | Status: AC
Start: 2021-08-27 — End: 2021-08-27
  Administered 2021-08-27: 1000 mL via INTRAVENOUS

## 2021-08-27 MED ORDER — MELOXICAM 7.5 MG PO TABS: 7.5000 mg | ORAL_TABLET | Freq: Every day | ORAL | 5 refills | Status: DC

## 2021-08-27 MED ORDER — MECLIZINE HCL 25 MG PO TABS: 25.0000 mg | ORAL_TABLET | Freq: Three times a day (TID) | ORAL | 0 refills | Status: DC | PRN

## 2021-08-27 MED ORDER — DIAZEPAM 5 MG/ML IJ SOLN
5.0000 mg | Freq: Once | INTRAMUSCULAR | Status: AC
  Administered 2021-08-27: 5 mg via INTRAVENOUS
  Filled 2021-08-27: qty 2

## 2021-08-27 NOTE — ED Notes (Signed)
Pt a/o. Light and movement makes dizziness worse. Denies trouble swallowing or talking. Denies weakness on one side more than the other. Speech clear.

## 2021-08-27 NOTE — Telephone Encounter (Signed)
We have to have the information because his MRI was approved  I just need to know where to pull it up since I can only see the claim number the fax and phone numbers are blank.

## 2021-08-27 NOTE — ED Provider Notes (Signed)
[facility] EMERGENCY DEPARTMENT Provider Note   CSN: [id number] Arrival date & time: 08/27/21  [id number]     History  Chief Complaint  Patient presents with   Dizziness    Ronald Hardin is a 46 y.o. male.   Dizziness Quality:  Room spinning Onset quality:  Gradual Duration:  5 days Timing:  Intermittent Progression:  Worsening Relieved by:  Being still and closing eyes Worsened by:  Turning head Associated symptoms: nausea   Associated symptoms: no vomiting   Patient presents for dizziness.  Medical history is notable for HTN.  He has been prescribed HCTZ and amlodipine.  He ran out of these prescriptions but started again 2 weeks ago.  He reports dizziness and nausea starting 4 days ago.  He feels that these are associated with use of his blood pressure medications.  Vertigo is described as positional.  He states that it seems to worsen when he turns to the right.  With his vertigo, he has had nausea.  He has not had vomiting.  He denies any recent URIs.      Home Medications Prior to Admission medications   Medication Sig Start Date End Date Taking? Authorizing Provider  meclizine (ANTIVERT) 25 MG tablet Take 1 tablet (25 mg total) by mouth 3 (three) times daily as needed for up to 16 doses for dizziness. 08/27/21  Yes Godfrey Pick, MD  amLODipine (NORVASC) 10 MG tablet Take 1 tablet (10 mg total) by mouth daily. 08/13/21   Fransico Meadow, PA-C  hydrochlorothiazide (HYDRODIURIL) 25 MG tablet Take 1 tablet (25 mg total) by mouth daily. 01/30/21   Varney Biles, MD  meloxicam (MOBIC) 7.5 MG tablet Take 1 tablet (7.5 mg total) by mouth daily. 08/27/21   Carole Civil, MD      Allergies    Patient has no known allergies.    Review of Systems   Review of Systems  Gastrointestinal:  Positive for nausea. Negative for vomiting.  Neurological:  Positive for dizziness.  All other systems reviewed and are negative.  Physical Exam Updated Vital Signs BP 122/86    Pulse 64     Temp 98.6 F (37 C) (Oral)    Resp 15    Ht 5\' 9"  (1.753 m)    Wt 75.8 kg    SpO2 99%    BMI 24.68 kg/m  Physical Exam Vitals and nursing note reviewed.  Constitutional:      General: He is not in acute distress.    Appearance: Normal appearance. He is well-developed and normal weight. He is not ill-appearing, toxic-appearing or diaphoretic.  HENT:     Head: Normocephalic and atraumatic.     Right Ear: Tympanic membrane, ear canal and external ear normal.     Left Ear: Tympanic membrane, ear canal and external ear normal.     Nose: Nose normal.     Mouth/Throat:     Mouth: Mucous membranes are moist.     Pharynx: Oropharynx is clear.  Eyes:     General: No visual field deficit.    Extraocular Movements: Extraocular movements intact.     Conjunctiva/sclera: Conjunctivae normal.     Comments: Horizontal nystagmus that is most prominent when deviating gaze to the right.  Cardiovascular:     Rate and Rhythm: Normal rate and regular rhythm.     Heart sounds: No murmur heard. Pulmonary:     Effort: Pulmonary effort is normal. No respiratory distress.     Breath sounds: Normal breath  sounds. No wheezing or rales.  Abdominal:     Palpations: Abdomen is soft.     Tenderness: There is no abdominal tenderness.  Musculoskeletal:        General: No swelling. Normal range of motion.     Cervical back: Normal range of motion and neck supple. No rigidity or tenderness.  Skin:    General: Skin is warm and dry.     Capillary Refill: Capillary refill takes less than 2 seconds.  Neurological:     General: No focal deficit present.     Mental Status: He is alert and oriented to person, place, and time.     Cranial Nerves: Cranial nerves 2-12 are intact. No cranial nerve deficit, dysarthria or facial asymmetry.     Sensory: Sensation is intact. No sensory deficit.     Motor: Motor function is intact. No weakness, tremor, abnormal muscle tone or pronator drift.     Coordination: Coordination is  intact. Coordination normal. Finger-Nose-Finger Test normal.     Gait: Gait is intact. Gait normal.  Psychiatric:        Mood and Affect: Mood normal.        Behavior: Behavior normal.        Thought Content: Thought content normal.        Judgment: Judgment normal.    ED Results / Procedures / Treatments   Labs (all labs ordered are listed, but only abnormal results are displayed) Labs Reviewed  CBC WITH DIFFERENTIAL/PLATELET - Abnormal; Notable for the following components:      Result Value   WBC 3.2 (*)    Neutro Abs 1.4 (*)    All other components within normal limits  COMPREHENSIVE METABOLIC PANEL - Abnormal; Notable for the following components:   Potassium 3.4 (*)    Glucose, Bld 113 (*)    Total Bilirubin 1.4 (*)    All other components within normal limits  MAGNESIUM    EKG None  Radiology CT Head Wo Contrast  Result Date: 08/27/2021 CLINICAL DATA:  Dizziness. EXAM: CT HEAD WITHOUT CONTRAST TECHNIQUE: Contiguous axial images were obtained from the base of the skull through the vertex without intravenous contrast. RADIATION DOSE REDUCTION: This exam was performed according to the departmental dose-optimization program which includes automated exposure control, adjustment of the mA and/or kV according to patient size and/or use of iterative reconstruction technique. COMPARISON:  January 21, 2016. FINDINGS: Brain: Ventricular size is within normal limits. There is no evidence of hemorrhage or acute infarction. Stable 13 mm low density seen along the lower left sylvian fissure which may represent dilated perivascular space. No mass effect or midline shift is noted. Vascular: No hyperdense vessel or unexpected calcification. Skull: Normal. Negative for fracture or focal lesion. Sinuses/Orbits: Mild right maxillary sinusitis is noted. Other: None. IMPRESSION: Mild right maxillary sinusitis. No acute intracranial abnormality seen. Electronically Signed   By: Marijo Conception M.D.   On:  08/27/2021 09:03    Procedures Procedures    Medications Ordered in ED Medications  lactated ringers bolus 1,000 mL (0 mLs Intravenous Stopped 08/27/21 1008)  diazepam (VALIUM) injection 5 mg (5 mg Intravenous Given 08/27/21 0846)  meclizine (ANTIVERT) tablet 25 mg (25 mg Oral Given 08/27/21 1220)    ED Course/ Medical Decision Making/ A&P                           Medical Decision Making Amount and/or Complexity of Data Reviewed Labs:  ordered. Radiology: ordered.  Risk Prescription drug management.   This patient presents to the ED for concern of vertigo, this involves an extensive number of treatment options, and is a complaint that carries with it a high risk of complications and morbidity.  The differential diagnosis includes CVA, BPPV, labyrinthitis, mastoiditis, Mnire's disease   Co morbidities that complicate the patient evaluation  Hypertension   Additional history obtained:  Additional history obtained from N/A External records from outside source obtained and reviewed including EMR   Lab Tests:  I Ordered, and personally interpreted labs.  The pertinent results include: Normal electrolytes, no anemia, mild leukopenia   Imaging Studies ordered:  I ordered imaging studies including CT head I independently visualized and interpreted imaging which showed no acute findings I agree with the radiologist interpretation   Cardiac Monitoring:  The patient was maintained on a cardiac monitor.  I personally viewed and interpreted the cardiac monitored which showed an underlying rhythm of: Sinus rhythm   Medicines ordered and prescription drug management:  I ordered medication including Valium and Antivert for symptomatic relief Reevaluation of the patient after these medicines showed that the patient improved I have reviewed the patients home medicines and have made adjustments as needed   Problem List / ED Course:  Healthy 46 year old presenting for 4  days of positional vertigo with associated nausea.  He denies any tinnitus, aural fullness, recent URIs, or any areas of numbness or weakness.  He denies any vision changes.  Vital signs normal on arrival.  Patient has no focal neurologic deficits on exam.  Laboratory work-up and CT head were reassuring.  On further reassessment, he does have horizontal nystagmus that is greatest with eye deviation to the right.  He did have reproducible symptoms with Dix-Hallpike maneuver to the right.  Epley maneuver was performed.  The patient was given Valium and meclizine for symptomatic relief.  He did have significant improvement following these interventions.  He was prescribed meclizine to be taken as needed for any further symptoms at home.  He is stable for discharge at this time.   Reevaluation:  After the interventions noted above, I reevaluated the patient and found that they have :improved   Social Determinants of Health:  Currently does not have primary care doctor   Dispostion:  After consideration of the diagnostic results and the patients response to treatment, I feel that the patent would benefit from discharge with PCP follow-up.          Final Clinical Impression(s) / ED Diagnoses Final diagnoses:  Vertigo    Rx / DC Orders ED Discharge Orders          Ordered    meclizine (ANTIVERT) 25 MG tablet  3 times daily PRN        08/27/21 1255              Godfrey Pick, MD 08/28/21 [phone]

## 2021-08-27 NOTE — Progress Notes (Signed)
He said he d just take ibuprofen

## 2021-08-27 NOTE — Telephone Encounter (Signed)
Per patient's employer and per insurer per note of 08/16/21, patient approved by Worker's Comp: (initial appontment, 08/19/21, with Dr Romeo Apple): CLAIM# [id number] / [employer], Ph# [phone], Fax # [phone]* (DIRECT Fax#, received 08/27/21): Fax [phone]/Attn: Abran Richard

## 2021-08-27 NOTE — Discharge Instructions (Signed)
A prescription for Antivert was sent to your pharmacy.  This is a medication that relieves dizziness and nausea.  Take this as needed.  Call number below to establish a primary care doctor.  If you experience symptoms worsening symptoms, please return to the emergency department.

## 2021-08-27 NOTE — ED Triage Notes (Signed)
Pt on hctz and amlodipine for weeks. Ran out rx. Started back around jan 17. Became dizzy with some nausea while walking 4 days ago. Stopped bp meds, dizziness better so started back on bp meds yesterday and dizziness started again about 2 hours after taking bp meds. Pt a/o.

## 2021-08-27 NOTE — Telephone Encounter (Signed)
Ronald Hardin, is it for the PT that you needed the W/C fax and phone numbers? Pt was seen 08/26/21, for review of MRI. Also, [employer] called and just today gave me a direct Fax #[phone]

## 2021-08-28 ENCOUNTER — Ambulatory Visit: Payer: Self-pay | Admitting: Orthopedic Surgery

## 2021-10-01 ENCOUNTER — Telehealth: Payer: Self-pay | Admitting: Orthopedic Surgery

## 2021-10-01 ENCOUNTER — Encounter: Payer: Self-pay | Admitting: Orthopedic Surgery

## 2021-10-01 NOTE — Telephone Encounter (Signed)
yes

## 2021-10-01 NOTE — Telephone Encounter (Signed)
Patient came to office relaying he is scheduled for jury duty beginning 10/07/21, the day of his next appointment; asking for letter to excuse him from  jury duty. Please advise. ?

## 2021-10-01 NOTE — Telephone Encounter (Signed)
Called patient to notify - note issued accordingly. ?

## 2021-10-07 ENCOUNTER — Encounter: Payer: Self-pay | Admitting: Orthopedic Surgery

## 2021-10-07 ENCOUNTER — Other Ambulatory Visit: Payer: Self-pay

## 2021-10-07 ENCOUNTER — Ambulatory Visit (INDEPENDENT_AMBULATORY_CARE_PROVIDER_SITE_OTHER): Payer: PRIVATE HEALTH INSURANCE | Admitting: Orthopedic Surgery

## 2021-10-07 DIAGNOSIS — M25511 Pain in right shoulder: Secondary | ICD-10-CM | POA: Diagnosis not present

## 2021-10-07 DIAGNOSIS — S46801D Unspecified injury of other muscles, fascia and tendons at shoulder and upper arm level, right arm, subsequent encounter: Secondary | ICD-10-CM

## 2021-10-07 DIAGNOSIS — M7521 Bicipital tendinitis, right shoulder: Secondary | ICD-10-CM

## 2021-10-07 NOTE — Progress Notes (Signed)
?  Chief Complaint  ?Patient presents with  ? Shoulder Pain  ?  RT shoulder/ s/p PT per W. Comp ?Pain is better but still having weakness and tenderness in some areas  ? ?Encounter Diagnoses  ?Name Primary?  ? Acute pain of right shoulder Yes  ? Biceps tendinitis of right upper extremity   ? Traumatic injury of right subscapularis tendon, subsequent encounter   ? ? ? ?Mr. Ronald Hardin is being followed after the work-related injury at [employer] ? ?He is 46 years old on the chart January 17 he went to the emergency room for acute right shoulder pain after lifting some boxes ? ?He did have an MRI he had a nonsurgical small mid substance superior subscapularis tear attenuated biceps tendon proximally some AC joint arthritis ? ?He has been in physical therapy he is improving however, he still has pain in the front of the shoulder especially with abduction external rotation and says it just does not feel right.  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 ?I recommend he continue physical therapy follow-up in 30 days out of work 30 days ? ?

## 2021-10-07 NOTE — Patient Instructions (Signed)
OOW 30 DAYS  ?

## 2021-10-09 ENCOUNTER — Other Ambulatory Visit: Payer: Self-pay

## 2021-10-09 DIAGNOSIS — M25511 Pain in right shoulder: Secondary | ICD-10-CM

## 2021-10-09 DIAGNOSIS — M7521 Bicipital tendinitis, right shoulder: Secondary | ICD-10-CM

## 2021-10-09 DIAGNOSIS — S46801D Unspecified injury of other muscles, fascia and tendons at shoulder and upper arm level, right arm, subsequent encounter: Secondary | ICD-10-CM

## 2021-10-09 DIAGNOSIS — S43431A Superior glenoid labrum lesion of right shoulder, initial encounter: Secondary | ICD-10-CM

## 2021-10-15 ENCOUNTER — Telehealth: Payer: Self-pay

## 2021-10-15 NOTE — Telephone Encounter (Signed)
How many more appts do you want this patient to have for PT? ?

## 2021-10-15 NOTE — Telephone Encounter (Signed)
Sam from [facility] called stating they needed an order for more appointments for this patient ?to have more physical therapy. ?

## 2021-10-16 ENCOUNTER — Other Ambulatory Visit: Payer: Self-pay

## 2021-10-16 DIAGNOSIS — M7521 Bicipital tendinitis, right shoulder: Secondary | ICD-10-CM

## 2021-10-16 DIAGNOSIS — S46801D Unspecified injury of other muscles, fascia and tendons at shoulder and upper arm level, right arm, subsequent encounter: Secondary | ICD-10-CM

## 2021-10-16 DIAGNOSIS — M25511 Pain in right shoulder: Secondary | ICD-10-CM

## 2021-10-16 DIAGNOSIS — S43431A Superior glenoid labrum lesion of right shoulder, initial encounter: Secondary | ICD-10-CM

## 2021-10-16 NOTE — Telephone Encounter (Signed)
4 weeks

## 2021-10-18 NOTE — Telephone Encounter (Signed)
New order faxed to pt office ?

## 2021-10-28 ENCOUNTER — Ambulatory Visit
Admission: EM | Admit: 2021-10-28 | Discharge: 2021-10-28 | Disposition: A | Discharge status: Home / Self Care | Payer: Self-pay | Attending: Family Medicine | Admitting: Family Medicine

## 2021-10-28 DIAGNOSIS — R42 Dizziness and giddiness: Secondary | ICD-10-CM

## 2021-10-28 DIAGNOSIS — I10 Essential (primary) hypertension: Secondary | ICD-10-CM

## 2021-10-28 LAB — POCT FASTING CBG KUC MANUAL ENTRY: POCT Glucose (KUC): 89 mg/dL (ref 70–99)

## 2021-10-28 MED ORDER — AMLODIPINE BESYLATE 5 MG PO TABS: 5.0000 mg | ORAL_TABLET | Freq: Every day | ORAL | 0 refills | Status: DC

## 2021-10-28 NOTE — ED Triage Notes (Signed)
Pt states he is experiencing dizziness  ? ?Pt states he was prescribed Amlodipine for blood pressure but in late January but he started feeling dizzy and stop taking the meds ? ?Pt was in serviced on how to take BP Meds ?

## 2021-10-28 NOTE — Discharge Instructions (Signed)
Stop your hydrochlorothiazide for the time being until you can follow-up with your primary care, who may decide to re-add this medication based on how your blood pressures have been doing.  I have sent in a lower dose of the amlodipine for you to take as I am concerned that you have been getting dizzy due to possibly overtreating your blood pressures.  Keep a log of your home blood pressure readings on the 5 mg amlodipine and bring this to your primary care follow-up.  If you are unable to get an appointment with your primary care in the next month please follow-up here for a recheck.  For worsening symptoms at any time go to the emergency department. ?
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
## 2021-11-11 ENCOUNTER — Ambulatory Visit (INDEPENDENT_AMBULATORY_CARE_PROVIDER_SITE_OTHER): Payer: Worker's Compensation | Admitting: Orthopedic Surgery

## 2021-11-11 DIAGNOSIS — S46801D Unspecified injury of other muscles, fascia and tendons at shoulder and upper arm level, right arm, subsequent encounter: Secondary | ICD-10-CM | POA: Diagnosis not present

## 2021-11-11 DIAGNOSIS — M7521 Bicipital tendinitis, right shoulder: Secondary | ICD-10-CM | POA: Diagnosis not present

## 2021-11-11 NOTE — Progress Notes (Signed)
Chief Complaint  ?Patient presents with  ? Shoulder Pain  ?  RT shoulder/ 30 day follow up ?improving  ? ? ?HPI: 46 year old male returns today his says his shoulder is getting better he notices that his fatigue is less he still has some catching sensations at the top of the shoulder ? ?His manual muscle testing is 5 out of 5 in abduction flexion and external rotation he has no pain with abduction external rotation ? ? ? ?Past Medical History:  ?Diagnosis Date  ? Hypertension   ? ? ?There were no vitals taken for this visit. ? ?Encounter Diagnoses  ?Name Primary?  ? Biceps tendinitis of right upper extremity Yes  ? Traumatic injury of right subscapularis tendon, subsequent encounter   ? ? ?Follow-up will be pending the discussion with his employer ?

## 2021-11-15 ENCOUNTER — Telehealth: Payer: Self-pay

## 2021-11-15 NOTE — Telephone Encounter (Signed)
Patient left voicemail stating he was wanting to see if Dr. Romeo Apple would give him an extension on his Worker's Comp situation. ?Tried to return his call and his voicemail has not been set up.  ? ?Please call and advise   [phone] ?

## 2021-11-18 ENCOUNTER — Encounter: Payer: Self-pay | Admitting: Orthopedic Surgery

## 2021-12-11 ENCOUNTER — Ambulatory Visit (INDEPENDENT_AMBULATORY_CARE_PROVIDER_SITE_OTHER): Payer: PRIVATE HEALTH INSURANCE | Admitting: Orthopedic Surgery

## 2021-12-11 ENCOUNTER — Encounter: Payer: Self-pay | Admitting: Orthopedic Surgery

## 2021-12-11 DIAGNOSIS — M7521 Bicipital tendinitis, right shoulder: Secondary | ICD-10-CM

## 2021-12-11 DIAGNOSIS — M25511 Pain in right shoulder: Secondary | ICD-10-CM

## 2021-12-11 DIAGNOSIS — S46801D Unspecified injury of other muscles, fascia and tendons at shoulder and upper arm level, right arm, subsequent encounter: Secondary | ICD-10-CM

## 2021-12-11 NOTE — Progress Notes (Signed)
Chief Complaint  ?Patient presents with  ? Shoulder Pain  ?  R/ shoulder is doing well, got most of my ROM back. I am able to do things better than last time I was here.  ? ?Case manager Lance Coon ? ?He says he has recovered nicely.  He says he thinks he can do all of his work activities ? ?He exhibits full range of motion and normal strength in his rotator cuff ? ?His MRI did show a ? ?He did have an MRI he had a nonsurgical small mid substance superior subscapularis tear attenuated biceps tendon proximally some AC joint arthritis ? ?Obviously the [facility] joint arthritis did not come from an acute injury however the mid substance superior subscapularis tear and the biceps tendon injury are most likely acute. ? ?He has reached MMI ? ?As far as a [employer]  ? ?I would rate this as 5% right upper extremity ? ?.dx ? ?Return as needed ?

## 2021-12-11 NOTE — Patient Instructions (Addendum)
You are released to go back to work with no restrictions.  ? ?Patient has reach MMI with 5% rating for the proximal biceps injury  ? ?Follow up as needed.  ?

## 2022-02-04 ENCOUNTER — Ambulatory Visit
Admission: EM | Admit: 2022-02-04 | Discharge: 2022-02-04 | Disposition: A | Discharge status: Home / Self Care | Payer: Self-pay | Attending: Family Medicine | Admitting: Family Medicine

## 2022-02-04 ENCOUNTER — Other Ambulatory Visit: Payer: Self-pay

## 2022-02-04 ENCOUNTER — Encounter: Payer: Self-pay | Admitting: Emergency Medicine

## 2022-02-04 DIAGNOSIS — H1032 Unspecified acute conjunctivitis, left eye: Secondary | ICD-10-CM

## 2022-02-04 MED ORDER — ERYTHROMYCIN 5 MG/GM OP OINT: TOPICAL_OINTMENT | OPHTHALMIC | 0 refills | Status: DC

## 2022-02-04 NOTE — ED Triage Notes (Signed)
Pt reports left eye drainage, pain, redness for last several days. Pt reports possible exposure to shavings at work, denies any known injury to eye.
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
## 2022-06-12 IMAGING — DX DG SHOULDER 2+V*R*
2 series · 2 of 2 positions shown · non-contrast
Comparison: No pertinent prior exams available for comparison.

CLINICAL DATA: Provided history: Limited range of motion, heard a
pop. Additional history provided: Patient reports pop and severe
pain in right shoulder after lifting boxes at work.

EXAM:
RIGHT SHOULDER - 2+ VIEW

[shoulder axial]
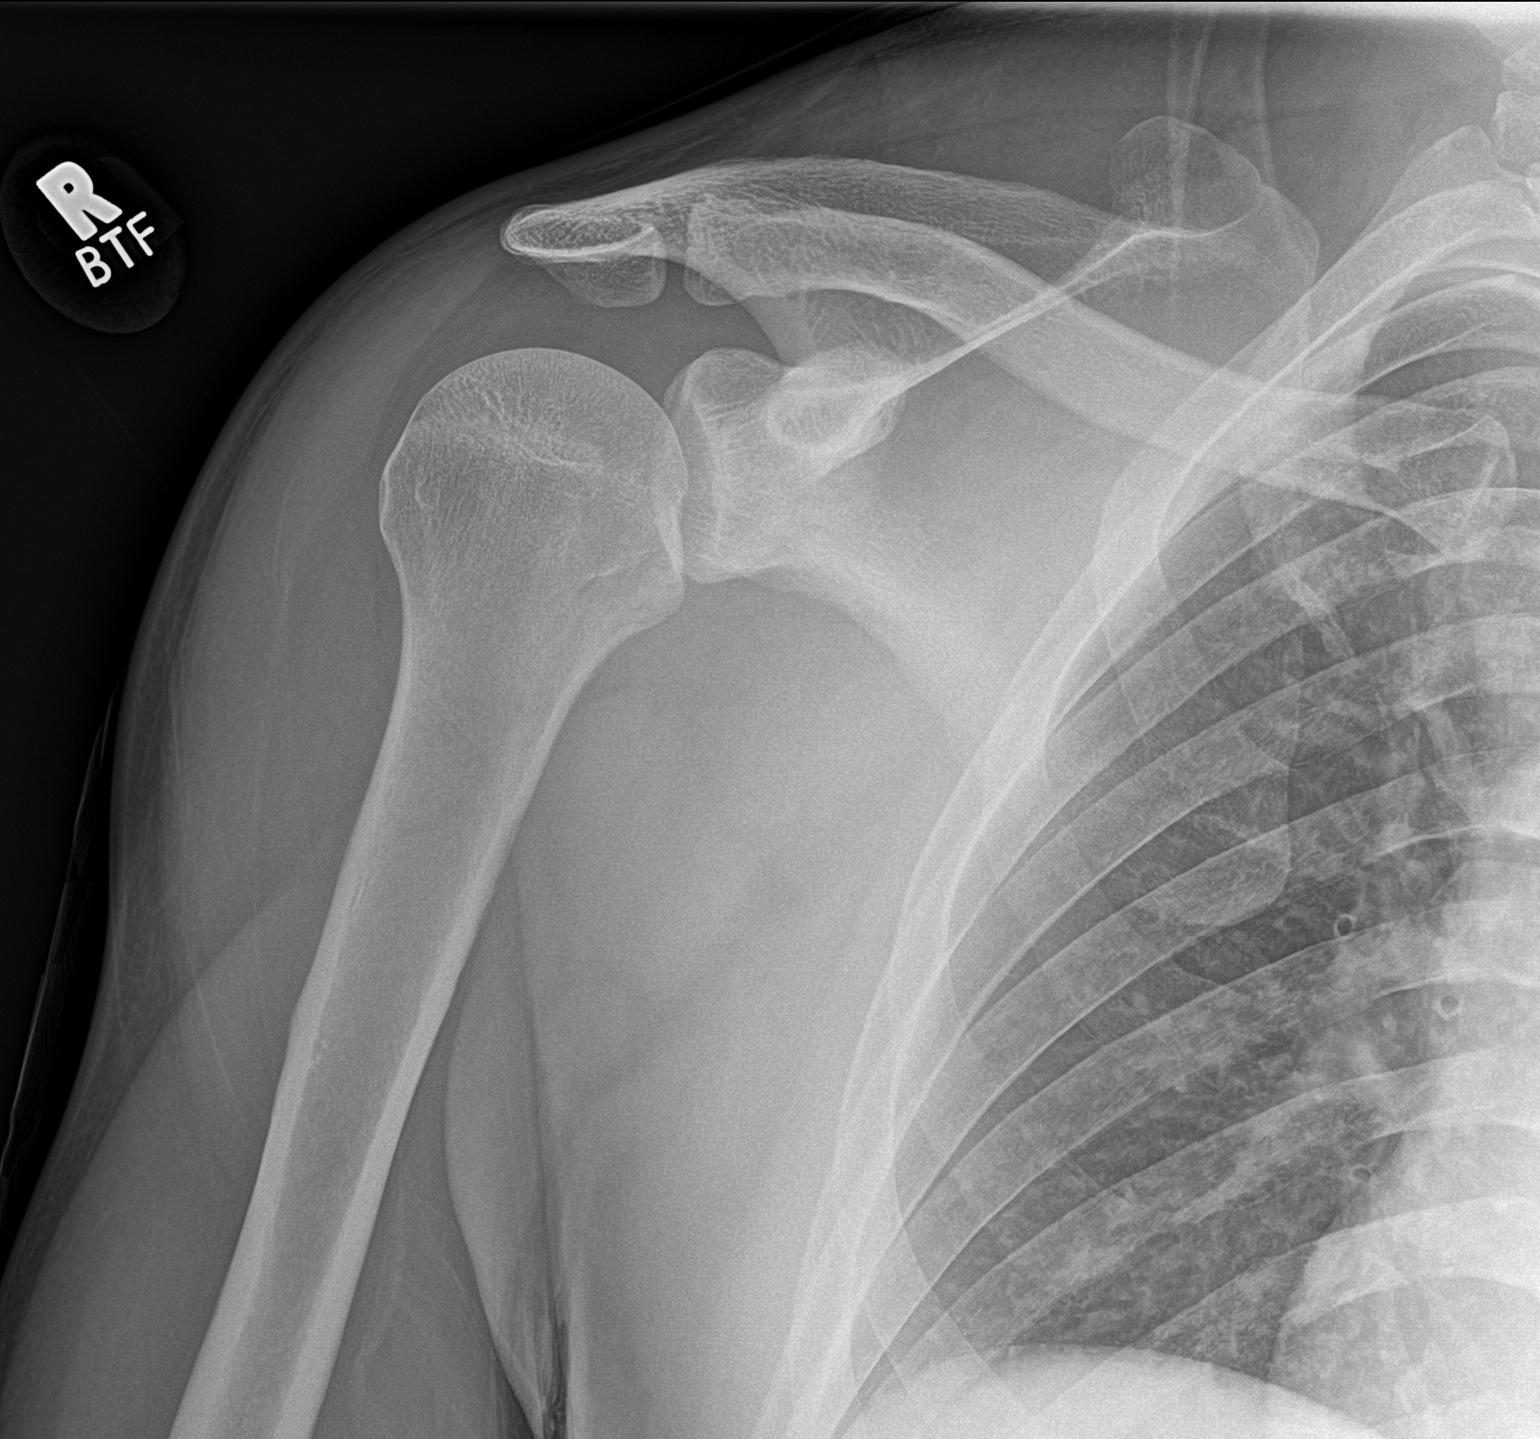

[shoulder swimmer]
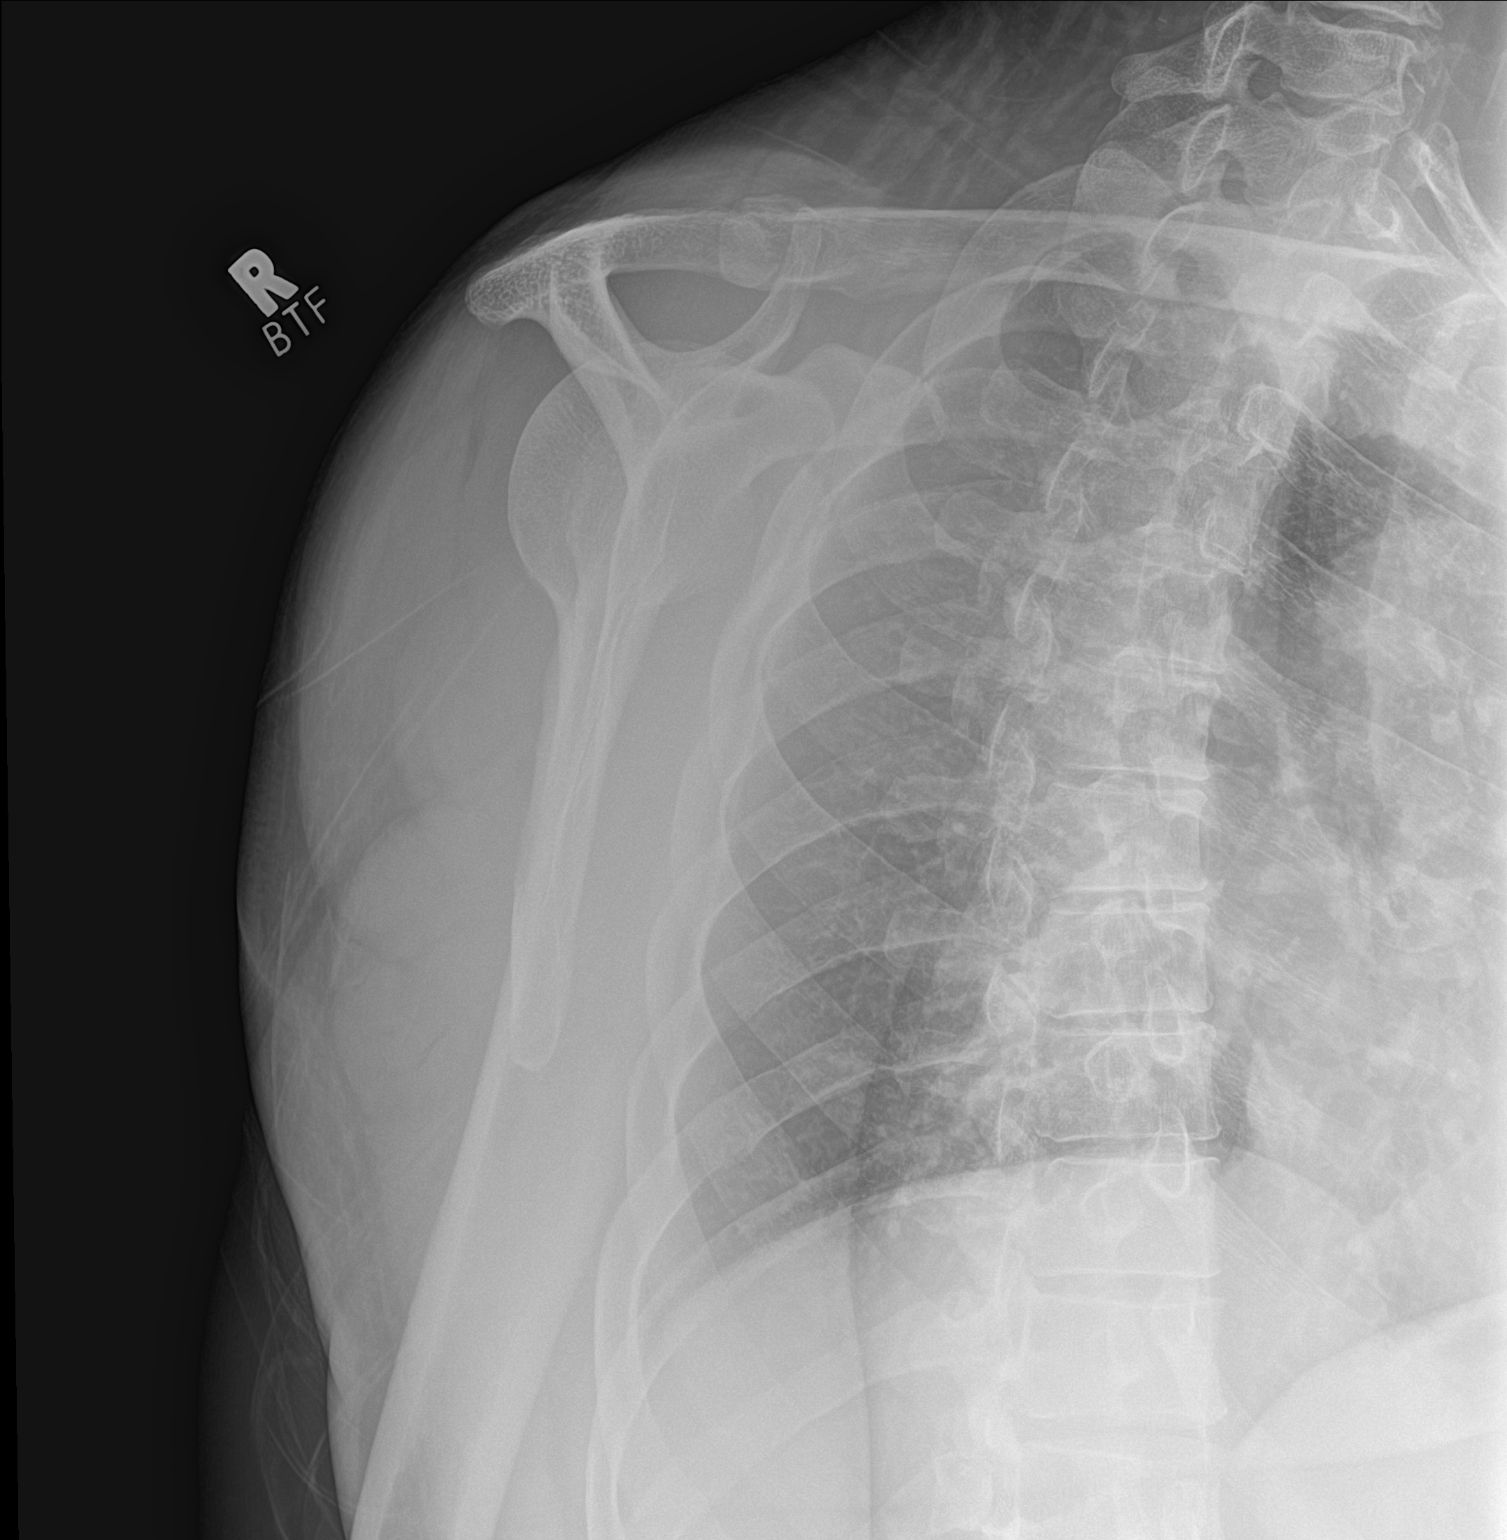

[2 of 2 positions shown; findings below may reference images not displayed]

FINDINGS: There is normal bony alignment.

No evidence of acute osseous or articular abnormality.

The joint spaces are maintained.
IMPRESSION: No evidence of acute osseous or articular abnormality.

## 2022-07-17 DIAGNOSIS — F1010 Alcohol abuse, uncomplicated: Secondary | ICD-10-CM | POA: Diagnosis not present

## 2022-07-18 DIAGNOSIS — F1010 Alcohol abuse, uncomplicated: Secondary | ICD-10-CM | POA: Diagnosis not present

## 2022-08-05 DIAGNOSIS — F1010 Alcohol abuse, uncomplicated: Secondary | ICD-10-CM | POA: Diagnosis not present

## 2022-09-25 ENCOUNTER — Encounter: Payer: Self-pay | Admitting: Radiology

## 2022-10-31 ENCOUNTER — Ambulatory Visit: Payer: Self-pay | Admitting: Internal Medicine

## 2022-11-03 ENCOUNTER — Encounter: Payer: Self-pay | Admitting: Internal Medicine

## 2022-11-03 ENCOUNTER — Ambulatory Visit (INDEPENDENT_AMBULATORY_CARE_PROVIDER_SITE_OTHER): Payer: BC Managed Care – PPO | Admitting: Internal Medicine

## 2022-11-03 VITALS — BP 148/88 | HR 72 | Ht 66.0 in | Wt 169.1 lb

## 2022-11-03 DIAGNOSIS — M25512 Pain in left shoulder: Secondary | ICD-10-CM | POA: Diagnosis not present

## 2022-11-03 DIAGNOSIS — I10 Essential (primary) hypertension: Secondary | ICD-10-CM | POA: Insufficient documentation

## 2022-11-03 DIAGNOSIS — E559 Vitamin D deficiency, unspecified: Secondary | ICD-10-CM | POA: Diagnosis not present

## 2022-11-03 DIAGNOSIS — Z0001 Encounter for general adult medical examination with abnormal findings: Secondary | ICD-10-CM | POA: Diagnosis not present

## 2022-11-03 MED ORDER — AMLODIPINE BESYLATE 5 MG PO TABS: 5.0000 mg | ORAL_TABLET | Freq: Every day | ORAL | 0 refills | Status: DC

## 2022-11-03 MED ORDER — MELOXICAM 7.5 MG PO TABS
7.5000 mg | ORAL_TABLET | Freq: Every day | ORAL | 0 refills | Status: DC
Start: 2022-11-03 — End: 2023-02-09

## 2022-11-03 NOTE — Patient Instructions (Signed)
Thank you, [name] for allowing [place] to provide your care today.   I have ordered the following labs for you:   Lab Orders         HIV Antibody (routine testing w rflx)         CBC with Differential/Platelet         CMP14+EGFR         Lipid panel         Hemoglobin A1c         Hepatitis C Antibody         VITAMIN D 25 Hydroxy (Vit-D Deficiency, Fractures)         Reminders: I will follow up with results.     Thurmon Fair, M.D.
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
## 2022-11-03 NOTE — Assessment & Plan Note (Signed)
Patient's BP today is 148/88 with a goal of <140/80.  The patient is currently not on any blood pressure medications. He was diagnosed with HTN approximately 2 years ago. He was started on amlodipine 10 mg in the past and had episodes of vertigo while on medication. He took 5 mg dose and tolerated well. He stopped medication and blood pressure was normal for a while at home. He has not been monitoring recently.  Plan: Start amlodipine 5 mg Follow up in 3 months Monitor BP at home and bring 10 readings to next visit

## 2022-11-03 NOTE — Assessment & Plan Note (Signed)
Patient has achy left shoulder pain for 3 weeks.He has not had any injury to left shoulder. Mostly bother by position he puts himself in with sleeping. Okay to sleep on left shoulder.    Plan: Exam showed some possible strain of supraspinatus tendon. Will prescribe Meloxicam for 7 days. Patient goes to gym and will start band exercises to strengthen muscle. Can consider formal PT if not improving with self exercises. Follow up if symptoms worsen or fail to improve

## 2022-11-03 NOTE — Assessment & Plan Note (Signed)
Patient has not routinely followed with PCP. Will check baseline labs today.

## 2022-11-04 LAB — CMP14+EGFR
ALT: 19 IU/L (ref 0–44)
AST: 24 IU/L (ref 0–40)
Albumin/Globulin Ratio: 2.1 (ref 1.2–2.2)
Albumin: 4.6 g/dL (ref 4.1–5.1)
Alkaline Phosphatase: 59 IU/L (ref 44–121)
BUN/Creatinine Ratio: 13 (ref 9–20)
BUN: 14 mg/dL (ref 6–24)
Bilirubin Total: 1.3 mg/dL — ABNORMAL HIGH (ref 0.0–1.2)
CO2: 21 mmol/L (ref 20–29)
Calcium: 9.6 mg/dL (ref 8.7–10.2)
Chloride: 104 mmol/L (ref 96–106)
Creatinine, Ser: 1.04 mg/dL (ref 0.76–1.27)
Globulin, Total: 2.2 g/dL (ref 1.5–4.5)
Glucose: 84 mg/dL (ref 70–99)
Potassium: 4.4 mmol/L (ref 3.5–5.2)
Sodium: 140 mmol/L (ref 134–144)
Total Protein: 6.8 g/dL (ref 6.0–8.5)
eGFR: 91 mL/min/{1.73_m2} (ref >59)

## 2022-11-04 LAB — HIV ANTIBODY (ROUTINE TESTING W REFLEX): HIV Screen 4th Generation wRfx: NONREACTIVE

## 2022-11-04 LAB — CBC WITH DIFFERENTIAL/PLATELET
Basophils Absolute: 0 10*3/uL (ref 0.0–0.2)
Basos: 1 %
EOS (ABSOLUTE): 0.1 10*3/uL (ref 0.0–0.4)
Eos: 3 %
Hematocrit: 44.5 % (ref 37.5–51.0)
Hemoglobin: 14.8 g/dL (ref 13.0–17.7)
Immature Grans (Abs): 0 10*3/uL (ref 0.0–0.1)
Immature Granulocytes: 0 %
Lymphocytes Absolute: 1.5 10*3/uL (ref 0.7–3.1)
Lymphs: 36 %
MCH: 29.7 pg (ref 26.6–33.0)
MCHC: 33.3 g/dL (ref 31.5–35.7)
MCV: 89 fL (ref 79–97)
Monocytes Absolute: 0.4 10*3/uL (ref 0.1–0.9)
Monocytes: 8 %
Neutrophils Absolute: 2.2 10*3/uL (ref 1.4–7.0)
Neutrophils: 52 %
Platelets: 183 10*3/uL (ref 150–450)
RBC: 4.98 x10E6/uL (ref 4.14–5.80)
RDW: 12.7 % (ref 11.6–15.4)
WBC: 4.2 10*3/uL (ref 3.4–10.8)

## 2022-11-04 LAB — HEMOGLOBIN A1C
Est. average glucose Bld gHb Est-mCnc: 105 mg/dL
Hgb A1c MFr Bld: 5.3 % (ref 4.8–5.6)

## 2022-11-04 LAB — LIPID PANEL
Chol/HDL Ratio: 2.9 ratio (ref 0.0–5.0)
Cholesterol, Total: 165 mg/dL (ref 100–199)
HDL: 56 mg/dL (ref >39)
LDL Chol Calc (NIH): 91 mg/dL (ref 0–99)
Triglycerides: 100 mg/dL (ref 0–149)
VLDL Cholesterol Cal: 18 mg/dL (ref 5–40)

## 2022-11-04 LAB — VITAMIN D 25 HYDROXY (VIT D DEFICIENCY, FRACTURES): Vit D, 25-Hydroxy: 17 ng/mL — ABNORMAL LOW (ref 30.0–100.0)

## 2022-11-04 LAB — HEPATITIS C ANTIBODY: Hep C Virus Ab: NONREACTIVE

## 2022-11-05 ENCOUNTER — Encounter: Payer: Self-pay | Admitting: Internal Medicine

## 2022-11-05 ENCOUNTER — Other Ambulatory Visit: Payer: Self-pay | Admitting: Internal Medicine

## 2022-11-05 DIAGNOSIS — E559 Vitamin D deficiency, unspecified: Secondary | ICD-10-CM

## 2022-11-05 MED ORDER — VITAMIN D3 25 MCG (1000 UT) PO CAPS: 1000.0000 [IU] | ORAL_CAPSULE | Freq: Every day | ORAL | 1 refills | Status: DC

## 2023-02-09 ENCOUNTER — Ambulatory Visit (INDEPENDENT_AMBULATORY_CARE_PROVIDER_SITE_OTHER): Payer: BC Managed Care – PPO | Admitting: Internal Medicine

## 2023-02-09 ENCOUNTER — Encounter: Payer: Self-pay | Admitting: Internal Medicine

## 2023-02-09 DIAGNOSIS — I10 Essential (primary) hypertension: Secondary | ICD-10-CM | POA: Diagnosis not present

## 2023-02-09 DIAGNOSIS — E559 Vitamin D deficiency, unspecified: Secondary | ICD-10-CM | POA: Insufficient documentation

## 2023-02-09 DIAGNOSIS — R5383 Other fatigue: Secondary | ICD-10-CM | POA: Insufficient documentation

## 2023-02-09 MED ORDER — VITAMIN D (ERGOCALCIFEROL) 1.25 MG (50000 UNIT) PO CAPS: 50000.0000 [IU] | ORAL_CAPSULE | ORAL | 0 refills | Status: AC

## 2023-02-09 MED ORDER — AMLODIPINE BESYLATE 5 MG PO TABS: 5.0000 mg | ORAL_TABLET | Freq: Every day | ORAL | 1 refills | Status: AC

## 2023-02-09 NOTE — Assessment & Plan Note (Signed)
He endorses a history of hypogonadism.  No previous treatment with testosterone replacement therapy.  States that he was previously followed at a men's clinic and TRT was not started due to cost.  He is concerned that his testosterone level is low today due to recent symptoms, namely fatigue. -Free/total testosterone levels ordered at patient's request

## 2023-02-09 NOTE — Assessment & Plan Note (Signed)
Amlodipine 5 mg daily was started at his last appointment.  His blood pressure today is 137/88.  He endorses noncompliance with amlodipine, noting that he has not been taking it consistently and has been out of medication recently. -Amlodipine 5 mg daily has been refilled today
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
## 2023-02-09 NOTE — Patient Instructions (Signed)
It was a pleasure to see you today.  Thank you for giving [place] the opportunity to be involved in your care.  Below is a brief recap of your visit and next steps.  We will plan to see you again in 3 months.  Summary Start weekly vitamin D supplementation for treatment of vitamin D deficiency Labs have been ordered to be completed one morning later this week Take amlodipine 5 mg daily as prescribed Follow up in 3 months

## 2023-02-09 NOTE — Assessment & Plan Note (Signed)
Noted on recent labs.  Repeat CMP and direct bilirubin level ordered today.

## 2023-02-09 NOTE — Assessment & Plan Note (Signed)
Noted on recent labs.  High-dose, weekly vitamin D supplementation has been prescribed today

## 2023-02-12 DIAGNOSIS — R5383 Other fatigue: Secondary | ICD-10-CM | POA: Diagnosis not present

## 2023-02-13 LAB — TESTOSTERONE,FREE AND TOTAL: Testosterone: 600 ng/dL (ref 264–916)

## 2023-02-13 LAB — CMP14+EGFR
AST: 21 IU/L (ref 0–40)
Alkaline Phosphatase: 75 IU/L (ref 44–121)
BUN: 18 mg/dL (ref 6–24)
Chloride: 103 mmol/L (ref 96–106)
Creatinine, Ser: 1.15 mg/dL (ref 0.76–1.27)
Potassium: 4.2 mmol/L (ref 3.5–5.2)
Sodium: 140 mmol/L (ref 134–144)
eGFR: 80 mL/min/{1.73_m2} (ref >59)

## 2023-02-13 LAB — BILIRUBIN, DIRECT: Bilirubin, Direct: 0.26 mg/dL (ref 0.00–0.40)

## 2023-02-14 LAB — CMP14+EGFR
ALT: 14 IU/L (ref 0–44)
Albumin: 4.5 g/dL (ref 4.1–5.1)
BUN/Creatinine Ratio: 16 (ref 9–20)
Bilirubin Total: 1.1 mg/dL (ref 0.0–1.2)
CO2: 22 mmol/L (ref 20–29)
Calcium: 9.6 mg/dL (ref 8.7–10.2)
Globulin, Total: 2.2 g/dL (ref 1.5–4.5)
Glucose: 86 mg/dL (ref 70–99)
Total Protein: 6.7 g/dL (ref 6.0–8.5)

## 2023-05-12 ENCOUNTER — Ambulatory Visit: Payer: BC Managed Care – PPO | Admitting: Internal Medicine

## 2023-05-14 ENCOUNTER — Encounter: Payer: Self-pay | Admitting: Internal Medicine
